# Patient Record
Sex: Female | Born: 1985 | Race: White | Hispanic: No | Smoking: Never smoker
Health system: Southern US, Community
[De-identification: ages and names within clinical notes are randomized; demographics above are authoritative.]

## PROBLEM LIST (undated history)

## (undated) DIAGNOSIS — F32A Depression, unspecified: Secondary | ICD-10-CM

## (undated) DIAGNOSIS — N83209 Unspecified ovarian cyst, unspecified side: Secondary | ICD-10-CM

## (undated) DIAGNOSIS — E538 Deficiency of other specified B group vitamins: Secondary | ICD-10-CM

## (undated) HISTORY — DX: Deficiency of other specified B group vitamins: E53.8

## (undated) HISTORY — DX: Unspecified ovarian cyst, unspecified side: N83.209

## (undated) HISTORY — PX: LAPAROSCOPY: SHX197

## (undated) HISTORY — DX: Depression, unspecified: F32.A

---

## 2009-06-24 DIAGNOSIS — N809 Endometriosis, unspecified: Secondary | ICD-10-CM

## 2009-06-24 HISTORY — DX: Endometriosis, unspecified: N80.9

## 2010-06-24 DIAGNOSIS — R87619 Unspecified abnormal cytological findings in specimens from cervix uteri: Secondary | ICD-10-CM

## 2010-06-24 HISTORY — DX: Unspecified abnormal cytological findings in specimens from cervix uteri: R87.619

## 2013-06-24 HISTORY — PX: TONSILLECTOMY: SUR1361

## 2018-05-25 ENCOUNTER — Ambulatory Visit: Payer: Self-pay | Admitting: Family Medicine

## 2018-06-30 ENCOUNTER — Ambulatory Visit: Payer: BC Managed Care – PPO | Admitting: Family Medicine

## 2018-06-30 ENCOUNTER — Encounter: Payer: Self-pay | Admitting: Family Medicine

## 2018-06-30 VITALS — BP 124/80 | HR 70 | Ht 68.0 in | Wt 240.0 lb

## 2018-06-30 DIAGNOSIS — Z23 Encounter for immunization: Secondary | ICD-10-CM

## 2018-06-30 DIAGNOSIS — Z6836 Body mass index (BMI) 36.0-36.9, adult: Secondary | ICD-10-CM | POA: Diagnosis not present

## 2018-06-30 DIAGNOSIS — Z7689 Persons encountering health services in other specified circumstances: Secondary | ICD-10-CM | POA: Diagnosis not present

## 2018-06-30 NOTE — Progress Notes (Signed)
Date:  06/30/2018   Name:  Heidi Johnston   DOB:  June 15, 1986   MRN:  [id number]   Chief Complaint: Establish Care (new to area)  Patient is a 33 year old female who presents for a comprehensive physical exam. The patient reports the following problems:none. Health maintenance has been reviewed immunizations.   Review of Systems  Constitutional: Negative.  Negative for chills, fatigue, fever and unexpected weight change.  HENT: Negative for congestion, ear discharge, ear pain, rhinorrhea, sinus pressure, sneezing and sore throat.   Eyes: Negative for photophobia, pain, discharge, redness and itching.  Respiratory: Negative for cough, shortness of breath, wheezing and stridor.   Gastrointestinal: Negative for abdominal pain, blood in stool, constipation, diarrhea, nausea and vomiting.  Endocrine: Negative for cold intolerance, heat intolerance, polydipsia, polyphagia and polyuria.  Genitourinary: Negative for dysuria, flank pain, frequency, hematuria, menstrual problem, pelvic pain, urgency, vaginal bleeding and vaginal discharge.  Musculoskeletal: Negative for arthralgias, back pain and myalgias.  Skin: Negative for rash.  Allergic/Immunologic: Negative for environmental allergies and food allergies.  Neurological: Negative for dizziness, weakness, light-headedness, numbness and headaches.  Hematological: Negative for adenopathy. Does not bruise/bleed easily.  Psychiatric/Behavioral: Negative for dysphoric mood. The patient is not nervous/anxious.     There are no active problems to display for this patient.   No Known Allergies  Past Surgical History:  Procedure Laterality Date  . LAPAROSCOPY     endometriosis  . TONSILLECTOMY  2015    Social History   Tobacco Use  . Smoking status: Never Smoker  . Smokeless tobacco: Never Used  Substance Use Topics  . Alcohol use: Never    Frequency: Never  . Drug use: Never     Medication list has been reviewed and  updated.  Current Meds  Medication Sig  . Norethindrone-Ethinyl Estradiol-Fe Biphas (LO LOESTRIN FE) 1 MG-10 MCG / 10 MCG tablet Take 1 tablet by mouth daily.    PHQ 2/9 Scores 06/30/2018  PHQ - 2 Score 0  PHQ- 9 Score 0    Physical Exam Vitals signs and nursing note reviewed.  Constitutional:      General: She is not in acute distress.    Appearance: She is not diaphoretic.  HENT:     Head: Normocephalic and atraumatic.     Right Ear: External ear normal.     Left Ear: External ear normal.     Nose: Nose normal.  Eyes:     General:        Right eye: No discharge.        Left eye: No discharge.     Conjunctiva/sclera: Conjunctivae normal.     Pupils: Pupils are equal, round, and reactive to light.  Neck:     Musculoskeletal: Normal range of motion and neck supple.     Thyroid: No thyromegaly.     Vascular: No JVD.  Cardiovascular:     Rate and Rhythm: Normal rate and regular rhythm.     Heart sounds: Normal heart sounds. No murmur. No friction rub. No gallop.   Pulmonary:     Effort: Pulmonary effort is normal.     Breath sounds: Normal breath sounds.  Abdominal:     General: Bowel sounds are normal.     Palpations: Abdomen is soft. There is no mass.     Tenderness: There is no abdominal tenderness. There is no guarding.  Musculoskeletal: Normal range of motion.  Lymphadenopathy:     Cervical: No cervical adenopathy.  Skin:  General: Skin is warm and dry.  Neurological:     Mental Status: She is alert.     Deep Tendon Reflexes: Reflexes are normal and symmetric.     BP 124/80   Pulse 70   Ht 5\' 8"  (1.727 m)   Wt 240 lb (108.9 kg)   LMP 06/21/2018 (Approximate)   BMI 36.49 kg/m   Assessment and Plan: 1. BMI 36.0-36.9,adult Health risks of being over weight were discussed and patient was counseled on weight loss options and exercise.  2. Establishing care with new doctor, encounter for And establish care with new physician patient's history was reviewed  and physical exam was done.  3. Need for diphtheria-tetanus-pertussis (Tdap) vaccine Gust and administered - Tdap vaccine greater than or equal to 7yo IM  4. Flu vaccine need Gust and administered - Flu Vaccine QUAD 6+ mos PF IM (Fluarix Quad PF)

## 2018-07-14 ENCOUNTER — Other Ambulatory Visit: Payer: Self-pay

## 2018-07-14 ENCOUNTER — Ambulatory Visit (INDEPENDENT_AMBULATORY_CARE_PROVIDER_SITE_OTHER): Payer: BC Managed Care – PPO | Admitting: Family Medicine

## 2018-07-14 ENCOUNTER — Encounter: Payer: Self-pay | Admitting: Family Medicine

## 2018-07-14 VITALS — BP 118/80 | HR 64 | Resp 16 | Ht 68.0 in | Wt 241.8 lb

## 2018-07-14 DIAGNOSIS — F329 Major depressive disorder, single episode, unspecified: Secondary | ICD-10-CM | POA: Diagnosis not present

## 2018-07-14 DIAGNOSIS — Z0000 Encounter for general adult medical examination without abnormal findings: Secondary | ICD-10-CM | POA: Diagnosis not present

## 2018-07-14 DIAGNOSIS — F32A Depression, unspecified: Secondary | ICD-10-CM

## 2018-07-14 DIAGNOSIS — Z8742 Personal history of other diseases of the female genital tract: Secondary | ICD-10-CM | POA: Diagnosis not present

## 2018-07-14 DIAGNOSIS — N809 Endometriosis, unspecified: Secondary | ICD-10-CM | POA: Diagnosis not present

## 2018-07-14 DIAGNOSIS — R5383 Other fatigue: Secondary | ICD-10-CM

## 2018-07-14 MED ORDER — SERTRALINE HCL 50 MG PO TABS: 50.0000 mg | ORAL_TABLET | Freq: Every day | ORAL | 3 refills | Status: DC

## 2018-07-14 NOTE — Progress Notes (Signed)
Date:  07/14/2018   Name:  Heidi Johnston   DOB:  07/03/1985   MRN:  [id number]   Chief Complaint: Annual Exam (breast exam, no pap)  Patient is a 33 year old female who presents for a comprehensive physical exam. The patient reports the following problems: fertility/depression. Health maintenance has been reviewed upto date  Depression         This is a new problem.  The current episode started more than 1 year ago.   The onset quality is gradual.   The problem occurs constantly.  The problem has been waxing and waning since onset.  Associated symptoms include decreased concentration, fatigue, irritable, restlessness, decreased interest, appetite change, body aches, myalgias, headaches and sad.  Associated symptoms include no helplessness, no hopelessness, does not have insomnia, no indigestion and no suicidal ideas.     The symptoms are aggravated by work stress and family issues (divorce).  Past treatments include nothing.   Review of Systems  Constitutional: Positive for appetite change and fatigue. Negative for chills, fever and unexpected weight change.  HENT: Negative for congestion, ear discharge, ear pain, rhinorrhea, sinus pressure, sneezing and sore throat.   Eyes: Negative for photophobia, pain, discharge, redness and itching.  Respiratory: Negative for cough, shortness of breath, wheezing and stridor.   Gastrointestinal: Negative for abdominal pain, blood in stool, constipation, diarrhea, nausea and vomiting.  Endocrine: Negative for cold intolerance, heat intolerance, polydipsia, polyphagia and polyuria.  Genitourinary: Negative for dysuria, flank pain, frequency, hematuria, menstrual problem, pelvic pain, urgency, vaginal bleeding and vaginal discharge.  Musculoskeletal: Positive for myalgias. Negative for arthralgias and back pain.  Skin: Negative for rash.  Allergic/Immunologic: Negative for environmental allergies and food allergies.  Neurological: Positive for  headaches. Negative for dizziness, weakness, light-headedness and numbness.  Hematological: Negative for adenopathy. Does not bruise/bleed easily.  Psychiatric/Behavioral: Positive for decreased concentration and depression. Negative for dysphoric mood and suicidal ideas. The patient is not nervous/anxious and does not have insomnia.     There are no active problems to display for this patient.   No Known Allergies  Past Surgical History:  Procedure Laterality Date  . LAPAROSCOPY     endometriosis  . TONSILLECTOMY  2015    Social History   Tobacco Use  . Smoking status: Never Smoker  . Smokeless tobacco: Never Used  Substance Use Topics  . Alcohol use: Never    Frequency: Never  . Drug use: Never     Medication list has been reviewed and updated.  Current Meds  Medication Sig  . Norethindrone-Ethinyl Estradiol-Fe Biphas (LO LOESTRIN FE) 1 MG-10 MCG / 10 MCG tablet Take 1 tablet by mouth daily.    PHQ 2/9 Scores 06/30/2018  PHQ - 2 Score 0  PHQ- 9 Score 0    Physical Exam Vitals signs and nursing note reviewed.  Constitutional:      General: She is irritable. She is not in acute distress.    Appearance: She is overweight. She is not diaphoretic.  HENT:     Head: Normocephalic and atraumatic.     Jaw: There is normal jaw occlusion.     Right Ear: External ear normal.     Left Ear: External ear normal.     Nose: Nose normal.  Eyes:     General: Lids are normal.        Right eye: No discharge.        Left eye: No discharge.     Conjunctiva/sclera: Conjunctivae  normal.     Pupils: Pupils are equal, round, and reactive to light.  Neck:     Musculoskeletal: Normal range of motion and neck supple. Normal range of motion. No edema, erythema or neck rigidity.     Thyroid: No thyroid mass or thyromegaly.     Vascular: Normal carotid pulses. No carotid bruit, hepatojugular reflux or JVD.  Cardiovascular:     Rate and Rhythm: Normal rate and regular rhythm.     Chest  Wall: PMI is not displaced.     Pulses: Normal pulses.          Carotid pulses are 2+ on the right side and 2+ on the left side.      Radial pulses are 2+ on the right side and 2+ on the left side.       Femoral pulses are 2+ on the right side and 2+ on the left side.      Popliteal pulses are 2+ on the right side and 2+ on the left side.       Dorsalis pedis pulses are 2+ on the right side and 2+ on the left side.       Posterior tibial pulses are 2+ on the right side and 2+ on the left side.     Heart sounds: Normal heart sounds, S1 normal and S2 normal. No murmur. No systolic murmur. No diastolic murmur. No friction rub. No gallop. No S3 or S4 sounds.   Pulmonary:     Effort: Pulmonary effort is normal.     Breath sounds: Normal breath sounds. No decreased air movement. No decreased breath sounds, wheezing, rhonchi or rales.  Chest:     Chest wall: No mass.     Breasts: Breasts are symmetrical.        Right: Normal. No swelling, bleeding, inverted nipple, mass, nipple discharge, skin change or tenderness.        Left: Normal. No swelling, bleeding, inverted nipple, mass, nipple discharge, skin change or tenderness.  Abdominal:     General: Bowel sounds are normal. There is no distension or abdominal bruit. There are no signs of injury.     Palpations: Abdomen is soft. There is no hepatomegaly, splenomegaly or mass.     Tenderness: There is no abdominal tenderness. There is no right CVA tenderness, left CVA tenderness, guarding or rebound.  Musculoskeletal: Normal range of motion.     Right shoulder: Normal.  Lymphadenopathy:     Head:     Right side of head: No submandibular adenopathy.     Left side of head: No submandibular adenopathy.     Cervical: No cervical adenopathy.     Right cervical: No superficial cervical adenopathy.    Left cervical: No superficial cervical adenopathy.     Upper Body:     Right upper body: No supraclavicular or axillary adenopathy.     Left upper  body: No supraclavicular or axillary adenopathy.  Skin:    General: Skin is warm and dry.     Capillary Refill: Capillary refill takes less than 2 seconds.  Neurological:     Mental Status: She is alert.     Deep Tendon Reflexes: Reflexes are normal and symmetric.     Reflex Scores:      Tricep reflexes are 2+ on the right side and 2+ on the left side.      Bicep reflexes are 2+ on the right side and 2+ on the left side.  Brachioradialis reflexes are 2+ on the right side and 2+ on the left side.      Patellar reflexes are 2+ on the right side and 2+ on the left side.      Achilles reflexes are 2+ on the right side and 2+ on the left side.    BP 118/80   Pulse 64   Resp 16   Ht 5\' 8"  (1.727 m)   Wt 241 lb 12.8 oz (109.7 kg)   LMP 07/11/2018   BMI 36.77 kg/m   Assessment and Plan: 1. Annual physical exam Patient presents for annual physical exam.  Patient relates no subjective nor objective concerns during the history and physical exam other than noted below.  Attained baseline labs of renal function panel and a thyroid panel with TSH. - Renal Function Panel - Lipid panel - Thyroid Panel With TSH  2. Reactive depression Patient has had a recent marital concern which is caused the active depression.  Patient was initiated on sertraline 1/2 tablet of a 50 mg for 2 weeks then to progress to 1 tablet a day.  Will recheck in 6 weeks. - sertraline (ZOLOFT) 50 MG tablet; Take 1 tablet (50 mg total) by mouth daily. One half tablet q day for 2 weeks  Dispense: 30 tablet; Refill: 3  3. Endometriosis Has a longstanding history of endometriosis for which she is on birth control pills.  Patient has concerned about fertility but it is unknown likely to be able to discontinue her hormonal therapy.  Refer to GYN for evaluation. - Ambulatory referral to Obstetrics / Gynecology  4. History of abnormal cervical Pap smear Has a history of abnormal cervical Pap smears referral to Dr. Dalbert GarnetBeasley  and [facility] for evaluation if necessary further Pap smear. - Ambulatory referral to Obstetrics / Gynecology  5. Fatigue due to depression Patient has had increasing depression with her fatigue and will check a thyroid panel with TSH to rule out the possibility of an underactive thyroid. - Thyroid Panel With TSH

## 2018-07-15 LAB — LIPID PANEL
CHOLESTEROL TOTAL: 226 mg/dL — AB (ref 100–199)
Chol/HDL Ratio: 3.7 ratio (ref 0.0–4.4)
HDL: 61 mg/dL (ref >39)
LDL Calculated: 140 mg/dL — ABNORMAL HIGH (ref 0–99)
TRIGLYCERIDES: 125 mg/dL (ref 0–149)
VLDL Cholesterol Cal: 25 mg/dL (ref 5–40)

## 2018-07-15 LAB — RENAL FUNCTION PANEL
Albumin: 4.5 g/dL (ref 3.8–4.8)
BUN/Creatinine Ratio: 11 (ref 9–23)
BUN: 10 mg/dL (ref 6–20)
CALCIUM: 9.4 mg/dL (ref 8.7–10.2)
CO2: 23 mmol/L (ref 20–29)
Chloride: 101 mmol/L (ref 96–106)
Creatinine, Ser: 0.94 mg/dL (ref 0.57–1.00)
GFR calc Af Amer: 93 mL/min/{1.73_m2} (ref >59)
GFR calc non Af Amer: 81 mL/min/{1.73_m2} (ref >59)
Glucose: 94 mg/dL (ref 65–99)
Phosphorus: 3.4 mg/dL (ref 3.0–4.3)
Potassium: 4.2 mmol/L (ref 3.5–5.2)
Sodium: 138 mmol/L (ref 134–144)

## 2018-07-15 LAB — TSH: TSH: 2.84 u[IU]/mL (ref 0.450–4.500)

## 2018-08-25 ENCOUNTER — Ambulatory Visit: Payer: BC Managed Care – PPO | Admitting: Family Medicine

## 2018-08-25 ENCOUNTER — Encounter: Payer: Self-pay | Admitting: Family Medicine

## 2018-08-25 VITALS — BP 120/80 | HR 80 | Ht 68.0 in | Wt 243.0 lb

## 2018-08-25 DIAGNOSIS — F329 Major depressive disorder, single episode, unspecified: Secondary | ICD-10-CM | POA: Diagnosis not present

## 2018-08-25 DIAGNOSIS — L0100 Impetigo, unspecified: Secondary | ICD-10-CM | POA: Diagnosis not present

## 2018-08-25 DIAGNOSIS — F419 Anxiety disorder, unspecified: Secondary | ICD-10-CM

## 2018-08-25 MED ORDER — DOXYCYCLINE HYCLATE 100 MG PO TABS: 100.0000 mg | ORAL_TABLET | Freq: Two times a day (BID) | ORAL | 0 refills | Status: DC

## 2018-08-25 MED ORDER — SERTRALINE HCL 100 MG PO TABS: 100.0000 mg | ORAL_TABLET | Freq: Every day | ORAL | 3 refills | Status: DC

## 2018-08-25 MED ORDER — MUPIROCIN 2 % EX OINT: 1.0000 "application " | TOPICAL_OINTMENT | Freq: Two times a day (BID) | CUTANEOUS | 0 refills | Status: DC

## 2018-08-25 NOTE — Progress Notes (Signed)
Date:  08/25/2018   Name:  Heidi Johnston   DOB:  08-18-1985   MRN:  [id number]   Chief Complaint: Depression (follow up- PHQ9=7 down from 17. )  Depression         This is a new problem.  The current episode started more than 1 month ago.   The onset quality is gradual.   The problem occurs daily.  The problem has been gradually improving since onset.  Associated symptoms include decreased concentration, irritable, restlessness, decreased interest and sad.  Associated symptoms include no fatigue, no helplessness, no hopelessness, does not have insomnia, no myalgias, no headaches and no suicidal ideas.  Past treatments include SSRIs - Selective serotonin reuptake inhibitors.  Compliance with treatment is good.  Previous treatment provided moderate relief.  Past medical history includes anxiety.   Anxiety  Presents for follow-up visit. Symptoms include decreased concentration and restlessness. Patient reports no compulsions, dizziness, excessive worry, feeling of choking, hyperventilation, insomnia, nausea, nervous/anxious behavior, palpitations, panic, shortness of breath or suicidal ideas. The severity of symptoms is mild.    Rash  This is a new problem. The current episode started in the past 7 days. The problem has been gradually improving since onset. The affected locations include the abdomen. The rash is characterized by redness, itchiness and draining. Pertinent negatives include no congestion, cough, diarrhea, eye pain, fatigue, fever, rhinorrhea, shortness of breath, sore throat or vomiting.    Review of Systems  Constitutional: Negative.  Negative for chills, fatigue, fever and unexpected weight change.  HENT: Negative for congestion, ear discharge, ear pain, rhinorrhea, sinus pressure, sneezing and sore throat.   Eyes: Negative for photophobia, pain, discharge, redness and itching.  Respiratory: Negative for cough, shortness of breath, wheezing and stridor.   Cardiovascular:  Negative for palpitations.  Gastrointestinal: Negative for abdominal pain, blood in stool, constipation, diarrhea, nausea and vomiting.  Endocrine: Negative for cold intolerance, heat intolerance, polydipsia, polyphagia and polyuria.  Genitourinary: Negative for dysuria, flank pain, frequency, hematuria, menstrual problem, pelvic pain, urgency, vaginal bleeding and vaginal discharge.  Musculoskeletal: Negative for arthralgias, back pain and myalgias.  Skin: Negative for rash.  Allergic/Immunologic: Negative for environmental allergies and food allergies.  Neurological: Negative for dizziness, weakness, light-headedness, numbness and headaches.  Hematological: Negative for adenopathy. Does not bruise/bleed easily.  Psychiatric/Behavioral: Positive for decreased concentration and depression. Negative for dysphoric mood and suicidal ideas. The patient is not nervous/anxious and does not have insomnia.     There are no active problems to display for this patient.   No Known Allergies  Past Surgical History:  Procedure Laterality Date  . LAPAROSCOPY     endometriosis  . TONSILLECTOMY  2015    Social History   Tobacco Use  . Smoking status: Never Smoker  . Smokeless tobacco: Never Used  Substance Use Topics  . Alcohol use: Never    Frequency: Never  . Drug use: Never     Medication list has been reviewed and updated.  Current Meds  Medication Sig  . Norethindrone-Ethinyl Estradiol-Fe Biphas (LO LOESTRIN FE) 1 MG-10 MCG / 10 MCG tablet Take 1 tablet by mouth daily.  . sertraline (ZOLOFT) 50 MG tablet Take 1 tablet (50 mg total) by mouth daily. One half tablet q day for 2 weeks (Patient taking differently: Take 50 mg by mouth daily. )    PHQ 2/9 Scores 08/25/2018 07/14/2018 06/30/2018  PHQ - 2 Score 3 4 0  PHQ- 9 Score 7 17 0  Physical Exam Vitals signs and nursing note reviewed.  Constitutional:      General: She is irritable. She is not in acute distress.    Appearance:  She is not diaphoretic.  HENT:     Head: Normocephalic and atraumatic.     Right Ear: Tympanic membrane, ear canal and external ear normal.     Left Ear: Tympanic membrane, ear canal and external ear normal.     Nose: Nose normal.  Eyes:     General:        Right eye: No discharge.        Left eye: No discharge.     Conjunctiva/sclera: Conjunctivae normal.     Pupils: Pupils are equal, round, and reactive to light.  Neck:     Musculoskeletal: Normal range of motion and neck supple.     Thyroid: No thyromegaly.     Vascular: No JVD.  Cardiovascular:     Rate and Rhythm: Normal rate and regular rhythm.     Heart sounds: Normal heart sounds. No murmur. No friction rub. No gallop.   Pulmonary:     Effort: Pulmonary effort is normal.     Breath sounds: Normal breath sounds. No wheezing or rhonchi.  Abdominal:     General: Bowel sounds are normal.     Palpations: Abdomen is soft. There is no mass.     Tenderness: There is no abdominal tenderness. There is no guarding.  Musculoskeletal: Normal range of motion.  Lymphadenopathy:     Cervical: No cervical adenopathy.  Skin:    General: Skin is warm and dry.  Neurological:     Mental Status: She is alert.     Deep Tendon Reflexes: Reflexes are normal and symmetric.     BP 120/80   Pulse 80   Ht 5\' 8"  (1.727 m)   Wt 243 lb (110.2 kg)   LMP 08/11/2018 (Approximate)   BMI 36.95 kg/m   Assessment and Plan: 1. Reactive depression Patient has an improved PHQ score from 17-7 on present dosing of sertraline 50 mg.  However patient would like to creased the dosing and therefore she will 1-1/2 of her present dosing of 50 which would be a 75 mg dose 2 weeks and then a written prescription for 100 mg sertraline was given to patient to fill afterwards patient will return in 3 months for recheck as to how she is doing on this increased dosing.  2. Anxiety Patient has also noticed that there is been significant improvement of her anxiety and  would like to continue with her sertraline.  3. Impetigo It was noted that she had in the back area some irritation with redness and discomfort.  There is no vesicular rash patient is likely to have a folliculitis for which doxycycline 100 mg twice a day and Bactroban will be applied on a twice a day basis. - doxycycline (VIBRA-TABS) 100 MG tablet; Take 1 tablet (100 mg total) by mouth 2 (two) times daily.  Dispense: 20 tablet; Refill: 0 - mupirocin ointment (BACTROBAN) 2 %; Apply 1 application topically 2 (two) times daily.  Dispense: 22 g; Refill: 0

## 2018-08-31 ENCOUNTER — Other Ambulatory Visit: Payer: Self-pay

## 2018-08-31 DIAGNOSIS — T3695XA Adverse effect of unspecified systemic antibiotic, initial encounter: Principal | ICD-10-CM

## 2018-08-31 DIAGNOSIS — B379 Candidiasis, unspecified: Secondary | ICD-10-CM

## 2018-08-31 MED ORDER — FLUCONAZOLE 150 MG PO TABS: 150.0000 mg | ORAL_TABLET | Freq: Once | ORAL | 0 refills | Status: AC

## 2018-08-31 NOTE — Progress Notes (Unsigned)
Sent in Diflucan due to Doxy

## 2018-09-23 LAB — HM PAP SMEAR: HM Pap smear: ABNORMAL

## 2018-11-23 ENCOUNTER — Telehealth: Payer: Self-pay | Admitting: Family Medicine

## 2018-11-23 ENCOUNTER — Telehealth: Payer: Self-pay

## 2018-11-23 NOTE — Telephone Encounter (Signed)
Pt called stating she wanted to cancel her 3 month follow up with Dr Heidi Johnston & stated she did not want to reschedule the appointment.

## 2018-11-23 NOTE — Telephone Encounter (Signed)
Pt called to cancel appt for follow up depression. Was offered appt and said she did not want to reschedule

## 2018-11-26 ENCOUNTER — Ambulatory Visit: Payer: BC Managed Care – PPO | Admitting: Family Medicine

## 2018-12-21 ENCOUNTER — Other Ambulatory Visit: Payer: Self-pay | Admitting: Family Medicine

## 2019-01-21 ENCOUNTER — Other Ambulatory Visit: Payer: Self-pay | Admitting: Family Medicine

## 2019-01-21 ENCOUNTER — Other Ambulatory Visit: Payer: Self-pay

## 2019-01-22 ENCOUNTER — Other Ambulatory Visit: Payer: Self-pay

## 2019-01-22 ENCOUNTER — Encounter: Payer: Self-pay | Admitting: Family Medicine

## 2019-01-22 ENCOUNTER — Ambulatory Visit: Payer: BC Managed Care – PPO | Admitting: Family Medicine

## 2019-01-22 VITALS — BP 134/76 | HR 90 | Ht 68.0 in | Wt 250.0 lb

## 2019-01-22 DIAGNOSIS — F329 Major depressive disorder, single episode, unspecified: Secondary | ICD-10-CM

## 2019-01-22 DIAGNOSIS — L600 Ingrowing nail: Secondary | ICD-10-CM | POA: Diagnosis not present

## 2019-01-22 DIAGNOSIS — F419 Anxiety disorder, unspecified: Secondary | ICD-10-CM | POA: Diagnosis not present

## 2019-01-22 MED ORDER — SERTRALINE HCL 100 MG PO TABS: 100.0000 mg | ORAL_TABLET | Freq: Every day | ORAL | 1 refills | Status: DC

## 2019-01-22 MED ORDER — CEPHALEXIN 500 MG PO CAPS: 500.0000 mg | ORAL_CAPSULE | Freq: Four times a day (QID) | ORAL | 1 refills | Status: DC

## 2019-01-22 MED ORDER — MUPIROCIN 2 % EX OINT: 1.0000 "application " | TOPICAL_OINTMENT | Freq: Two times a day (BID) | CUTANEOUS | 0 refills | Status: DC

## 2019-01-22 NOTE — Progress Notes (Signed)
Date:  01/22/2019   Name:  Heidi Johnston   DOB:  03/24/1986   MRN:  [id number]   Chief Complaint: Depression (PHQ9=2)  Depression        This is a chronic problem.  The current episode started more than 1 year ago.   The onset quality is sudden.   The problem occurs constantly.  The problem has been gradually improving since onset.  Associated symptoms include decreased concentration.  Associated symptoms include no fatigue, no helplessness, no hopelessness, does not have insomnia, not irritable, no restlessness, no decreased interest, no appetite change, no body aches, no myalgias, no headaches, no indigestion, not sad and no suicidal ideas.     The symptoms are aggravated by medication.  Past treatments include SSRIs - Selective serotonin reuptake inhibitors.  Compliance with treatment is good.  Previous treatment provided mild relief.  Risk factors include a change in medication usage/dosage.    Review of Systems  Constitutional: Negative.  Negative for appetite change, chills, fatigue, fever and unexpected weight change.  HENT: Negative for congestion, ear discharge, ear pain, rhinorrhea, sinus pressure, sneezing and sore throat.   Eyes: Negative for photophobia, pain, discharge, redness and itching.  Respiratory: Negative for cough, shortness of breath, wheezing and stridor.   Gastrointestinal: Negative for abdominal pain, blood in stool, constipation, diarrhea, nausea and vomiting.  Endocrine: Negative for cold intolerance, heat intolerance, polydipsia, polyphagia and polyuria.  Genitourinary: Negative for dysuria, flank pain, frequency, hematuria, menstrual problem, pelvic pain, urgency, vaginal bleeding and vaginal discharge.  Musculoskeletal: Negative for arthralgias, back pain and myalgias.  Skin: Negative for rash.  Allergic/Immunologic: Negative for environmental allergies and food allergies.  Neurological: Negative for dizziness, weakness, light-headedness, numbness and  headaches.  Hematological: Negative for adenopathy. Does not bruise/bleed easily.  Psychiatric/Behavioral: Positive for decreased concentration and depression. Negative for dysphoric mood and suicidal ideas. The patient is not nervous/anxious and does not have insomnia.     Patient Active Problem List   Diagnosis Date Noted   Reactive depression 08/25/2018   Anxiety 08/25/2018    No Known Allergies  Past Surgical History:  Procedure Laterality Date   LAPAROSCOPY     endometriosis   TONSILLECTOMY  2015    Social History   Tobacco Use   Smoking status: Never Smoker   Smokeless tobacco: Never Used  Substance Use Topics   Alcohol use: Never    Frequency: Never   Drug use: Never     Medication list has been reviewed and updated.  Current Meds  Medication Sig   Norethindrone-Ethinyl Estradiol-Fe Biphas (LO LOESTRIN FE) 1 MG-10 MCG / 10 MCG tablet Take 1 tablet by mouth daily.   sertraline (ZOLOFT) 100 MG tablet TAKE 1 TABLET BY MOUTH EVERY DAY    PHQ 2/9 Scores 01/22/2019 08/25/2018 07/14/2018 06/30/2018  PHQ - 2 Score 0 3 4 0  PHQ- 9 Score 2 7 17  0    BP Readings from Last 3 Encounters:  01/22/19 134/76  08/25/18 120/80  07/14/18 118/80    Physical Exam Vitals signs and nursing note reviewed.  Constitutional:      General: She is not irritable.She is not in acute distress.    Appearance: She is not diaphoretic.  HENT:     Head: Normocephalic and atraumatic.     Right Ear: Tympanic membrane, ear canal and external ear normal.     Left Ear: Tympanic membrane, ear canal and external ear normal.     Nose: Nose normal. No  congestion or rhinorrhea.  Eyes:     General:        Right eye: No discharge.        Left eye: No discharge.     Conjunctiva/sclera: Conjunctivae normal.     Pupils: Pupils are equal, round, and reactive to light.  Neck:     Musculoskeletal: Normal range of motion and neck supple.     Thyroid: No thyromegaly.     Vascular: No JVD.    Cardiovascular:     Rate and Rhythm: Normal rate and regular rhythm.     Heart sounds: Normal heart sounds. No murmur. No friction rub. No gallop.   Pulmonary:     Effort: Pulmonary effort is normal.     Breath sounds: Normal breath sounds. No wheezing or rhonchi.  Abdominal:     General: Bowel sounds are normal.     Palpations: Abdomen is soft. There is no mass.     Tenderness: There is no abdominal tenderness. There is no guarding.  Musculoskeletal: Normal range of motion.  Lymphadenopathy:     Cervical: No cervical adenopathy.  Skin:    General: Skin is warm and dry.     Capillary Refill: Capillary refill takes less than 2 seconds.  Neurological:     Mental Status: She is alert.     Deep Tendon Reflexes: Reflexes are normal and symmetric.     Wt Readings from Last 3 Encounters:  01/22/19 250 lb (113.4 kg)  08/25/18 243 lb (110.2 kg)  07/14/18 241 lb 12.8 oz (109.7 kg)    BP 134/76    Pulse 90    Ht 5\' 8"  (1.727 m)    Wt 250 lb (113.4 kg)    SpO2 98%    BMI 38.01 kg/m   Assessment and Plan: 1. Ingrowing nail, left great toe Patient has some mild ingrowing nail of the lateral aspect of the left great toe.  This looks like it is already been wedged out will apply Bactroban and use Keflex 500 mg twice a day for 5 days. - Ambulatory referral to Dermatology - mupirocin ointment (BACTROBAN) 2 %; Apply 1 application topically 2 (two) times daily.  Dispense: 22 g; Refill: 0 - cephALEXin (KEFLEX) 500 MG capsule; Take 1 capsule (500 mg total) by mouth 4 (four) times daily.  Dispense: 30 capsule; Refill: 1  2. Anxiety Chronic.  Controlled.  CAD score 0.  Although patient does concern for upcoming school year and presents of the COVID circumstance.  Continue Zoloft 100 mg daily.  3. Reactive depression PHQ 9 score 2.  Controlled.  Persistent.  Patient will continue Zoloft 100 mg/day.

## 2019-01-22 NOTE — Patient Instructions (Signed)

## 2019-04-12 ENCOUNTER — Telehealth: Payer: Self-pay

## 2019-04-12 NOTE — Telephone Encounter (Signed)
refills declined patient asking why. Advised she has never gotten them from [place]. She will ask OBGYN but wants to see if we will still give it to her now since she has been on it 20 yrs.

## 2019-05-06 ENCOUNTER — Other Ambulatory Visit: Payer: Self-pay

## 2019-05-06 DIAGNOSIS — Z20822 Contact with and (suspected) exposure to covid-19: Secondary | ICD-10-CM

## 2019-05-09 LAB — NOVEL CORONAVIRUS, NAA: SARS-CoV-2, NAA: NOT DETECTED

## 2019-05-25 ENCOUNTER — Telehealth: Payer: BC Managed Care – PPO | Admitting: Nurse Practitioner

## 2019-05-25 DIAGNOSIS — L03211 Cellulitis of face: Secondary | ICD-10-CM

## 2019-05-25 MED ORDER — CEPHALEXIN 500 MG PO CAPS: 500.0000 mg | ORAL_CAPSULE | Freq: Two times a day (BID) | ORAL | 0 refills | Status: DC

## 2019-05-25 NOTE — Progress Notes (Signed)

## 2019-05-28 ENCOUNTER — Encounter: Payer: Self-pay | Admitting: Family Medicine

## 2019-05-28 ENCOUNTER — Other Ambulatory Visit: Payer: Self-pay

## 2019-05-28 DIAGNOSIS — L0100 Impetigo, unspecified: Secondary | ICD-10-CM

## 2019-05-28 MED ORDER — CEPHALEXIN 500 MG PO CAPS: 500.0000 mg | ORAL_CAPSULE | Freq: Four times a day (QID) | ORAL | 0 refills | Status: DC

## 2019-07-14 ENCOUNTER — Ambulatory Visit (INDEPENDENT_AMBULATORY_CARE_PROVIDER_SITE_OTHER): Payer: BC Managed Care – PPO | Admitting: Family Medicine

## 2019-07-14 ENCOUNTER — Other Ambulatory Visit: Payer: Self-pay

## 2019-07-14 ENCOUNTER — Encounter: Payer: Self-pay | Admitting: Family Medicine

## 2019-07-14 VITALS — BP 120/64 | HR 68 | Ht 68.0 in | Wt 266.0 lb

## 2019-07-14 DIAGNOSIS — Z8619 Personal history of other infectious and parasitic diseases: Secondary | ICD-10-CM

## 2019-07-14 DIAGNOSIS — L0100 Impetigo, unspecified: Secondary | ICD-10-CM

## 2019-07-14 MED ORDER — AMOXICILLIN-POT CLAVULANATE 875-125 MG PO TABS: 1.0000 | ORAL_TABLET | Freq: Two times a day (BID) | ORAL | 0 refills | Status: DC

## 2019-07-14 MED ORDER — FLUCONAZOLE 150 MG PO TABS: 150.0000 mg | ORAL_TABLET | Freq: Once | ORAL | 0 refills | Status: AC

## 2019-07-14 MED ORDER — MUPIROCIN 2 % EX OINT: 1.0000 "application " | TOPICAL_OINTMENT | Freq: Two times a day (BID) | CUTANEOUS | 0 refills | Status: DC

## 2019-07-14 NOTE — Patient Instructions (Signed)
Preventing Health Care-Associated MRSA Methicillin-resistant Staphylococcus aureus (MRSA) infection is caused by a type of bacteria called Staphylococcus aureus,or staph, that no longer responds to common antibiotic medicines (drug-resistant bacteria). Infection may occur during a stay in a hospital, rehabilitation facility, nursing home, or other health care facility. This is called health care-associated MRSA (HA-MRSA). How can HA-MRSA affect me? Most of the time, MRSA can be on the skin or in the nose without causing problems. This is called colonization. However, if MRSA enters the body through a cut, a sore, or an invasive medical procedure or device, it can cause a serious infection. These infections can include:  Skin infections.  Bone or joint infections.  Pneumonia.  Bloodstream infections (sepsis). MRSA infections can be hard to treat. They can also spread quickly, especially in health care settings. What can increase my risk? You are more likely to get HA-MRSA if you:  Were recently in hospital or other health care facility.  Had a long hospital stay, especially in an intensive care unit or burn unit.  Have been on antibiotic medicines.  Had a surgery or procedure.  Have colonies of MRSA in your nose or on your skin.  Are on kidney dialysis.  Have a short-term or long-term use of a catheter or IV line.  Have a weak body defense system (immune system).  Have ever had a MRSA infection. What actions can be taken to prevent HA-MRSA? Your health care team will work to help prevent the spread of MRSA. They may:  Screen for MRSA on your skin or in your nose when you are admitted to a hospital.  Give you antibiotics only when you need them.  Wear gloves when working with patients. Gloves will be changed for each patient.  Wash hands after touching blood, body fluids, or other items that have germs.  Wash hands with soap and water or alcohol-based hand sanitizer every  time they enter or leave a patient room.  Use mouth, nose, and eye protection during procedures or regular patient care.  Wear a gown during procedures or regular patient care.  Properly dispose of single-use patient devices, such as needles or bandages.  Clean (disinfect) equipment and surfaces in patient rooms.  Handle soiled laundry in special bags.  Use contact precautions.  Treat known cases of MRSA infections.  Explain how to prevent infection and take care of yourself at home. Follow these instructions after you leave the hospital. What are contact precautions? Contact precautions are steps that your health care team may take to prevent the spread of HA-MRSA among hospital staff and visitors. You may be placed on contact precautions if you:  Were admitted to the hospital with a MRSA infection.  Have MRSA and there are others who have it in the same hospital.  Have MRSA, and you also have: ? A wound with blood and other fluids. ? Diarrhea. ? Bodily fluids that cannot be stopped. Contact precautions may vary based on facility policies. You may be taken off contact precautions depending on your overall health and the policies of the facility. Talk to your health care provider if you have questions about contact precautions. Where to find more information You can get more information about HA-MRSA from:  The Centers for Disease Control and Prevention (CDC): [employer]ee  Your local or state health department. Contact a health care provider if you have:  An infection on your skin, such as: ? A pus-filled pimple or boil. ? A sore (abscess) under your skin  or somewhere in your body.  Signs of infection after a recent procedure, including: ? Warmth, redness, or tenderness around your incision site. ? A red line that spreads from your incision. ? Incision drainage that is tan, yellow, or green. ? A bad smell coming from your incision or bandage (dressing). Get help  right away if you have:  A skin infection and fever and chills.  Nausea or vomiting, or you cannot take medicine without vomiting.  Trouble breathing.  Chest pain. These symptoms may represent a serious problem that is an emergency. Do not wait to see if the symptoms will go away. Get medical help right away. Call your local emergency services (911 in the [place] Do not drive yourself to the hospital. Summary  Health care-associated MRSA (HA-MRSA) is an infection that a person can get during a stay in a hospital, rehabilitation facility, nursing home, or other health care facility.  Your health care team may take steps to prevent the spread of HA-MRSA among hospital staff and visitors. These safety measures are called contact precautions.  Your health care team will explain how to prevent infection and take care of yourself at home. After leaving the hospital, make sure you follow these instructions.  Contact a health care provider if you have signs of infection, such as a boil with pus, a sore under the skin, or warmth, redness, or tenderness.  Get help right away if you have a skin infection and a fever, or if you have nausea, trouble breathing, or chest pain. This information is not intended to replace advice given to you by your health care provider. Make sure you discuss any questions you have with your health care provider. Document Revised: 03/03/2019 Document Reviewed: 08/28/2018 Elsevier Patient Education  2020 [employer]. MRSA Infection, Self-Care, Adult Methicillin-resistant Staphylococcus aureus (MRSA) infection is caused by bacteria that no longer respond to antibiotic medicines. MRSA infection can be hard to treat. Self-care is important after being diagnosed with MRSA. Following instructions for self-care will:  Help you heal well.  Prevent the infection from spreading to others. Your health care provider may also give you more specific instructions. If you have  problems or questions, contact your health care provider. What are the risks? MRSA can be on the skin or in the nose of many people without causing problems. This is called MRSA colonization. However, MRSA can cause problems for you and others if it enters the body through a cut, a sore, or an invasive medical procedure or device.  If MRSA enters your body, it may cause serious problems, such as: ? Skin infections. ? Bone or joint infections. ? Pneumonia. ? Bloodstream infections (sepsis).  If you have these infections, MRSA may spread to others, including: ? Visitors or other patients in the hospital. ? Friends, family, or other people at home or in your community. Supplies needed:  Soap and water.  Germ-free (sterile) dressing.  Alcohol-based hand sanitizer (optional).  Home cleaning solutions that contain bleach.  Clean or disposable towels. How to prevent MRSA infections Take these steps to avoid getting another MRSA infection and to prevent the bacteria from spreading to others. Hand washing   Wash your hands frequently with soap and water. If soap and water are not available, use an alcohol-based hand sanitizer. Dry your hands with a clean or disposable towel.  Make sure that everyone in the household washes their hands often. Wound care  If you have a wound, follow instructions from your health care  provider about how to take care of your wound. Make sure you:  Wash your hands with soap and water before and after you change your bandage (dressing). If soap and water are not available, use an alcohol-based hand sanitizer.  Change your dressing as told by your health care provider.  Leave any stitches (sutures), skin glue, or adhesive strips in place. These skin closures may need to be in place for 2 weeks or longer. If adhesive strip edges start to loosen and curl up, you may trim the loose edges. Do not remove adhesive strips completely unless your health care provider  tells you to do that.  Clean wounds, cuts, and abrasions with soap and water and cover them with dry, sterile dressings until they heal.  Check your wound every day for signs of infection. Check for: ? More redness, swelling, or pain. ? More fluid or blood. ? Warmth. ? Pus or a bad smell.  If you have a wound that seems to be infected, ask your health care provider if a culture should be done for MRSA and other bacteria. Personal hygiene  Maintain good hygiene by bathing often and keeping your body clean.  Wash hands before preparing food.  Always shower after exercising.  Wash towels, bedding, and clothes in the washing machine with detergent and hot water. Dry them in a hot dryer. General tips  Take your antibiotic medicine as told by your health care provider. Take them only when absolutely necessary. Do not stop taking the antibiotic even if you start to feel better.  Avoid close contact with others as much as possible.  Do not use towels, razors, toothbrushes, bedding, or other items that will be used by others.  Clean surfaces regularly to remove germs (disinfection). Use products or solutions that contain bleach. Make sure you disinfect bathroom surfaces, food preparation areas, and doorknobs. Follow these instructions at home:  Take over-the-counter and prescription medicines only as told by your health care provider.  Tell all health care providers who care for you that you have MRSA or that you have had MRSA.  If you are breastfeeding, talk to your health care provider about MRSA. You may be asked to temporarily stop breastfeeding.  If you have an invasive medical device, make sure that you know how to take care of it to prevent infection.  Keep all follow-up visits as told by your health care provider. This is important. Contact a health care provider if:  Your infection seems to be getting worse. Signs may include: ? Warmth, redness, or tenderness around your  wound site. ? A red line that spreads from your infection site. ? A dark color in the area around your infection. ? Wound drainage that is tan, yellow, or green. ? A bad smell coming from your wound.  You have symptoms of a new MRSA infection: ? A pus-filled pimple. ? A boil on your skin. ? Pus draining from your skin. ? A sore (abscess) under your skin or somewhere in your body. ? Fever with or without chills. Get help right away if:  You have trouble breathing.  You feel nauseous, you vomit, or you cannot take medicine without vomiting.  You have chest pain. These symptoms may represent a serious problem that is an emergency. Do not wait to see if the symptoms will go away. Get medical help right away. Call your local emergency services (911 in the [place] Do not drive yourself to the hospital. Summary  Self-care is important  after being diagnosed with MRSA. This will help you heal well and prevent the infection from spreading to others.  Wash your hands often, avoid close contact with others, and avoid sharing towels, razors, toothbrushes, and bedding with other people. This will help prevent the infection from spreading to others.  If you are being treated for a MRSA infection, make sure to take over-the-counter and prescription medicines as told. Finish all antibiotic medicine even if you start to feel better.  If you have a wound, follow instructions from your health care provider about how to take care of it. Check your wound every day for signs of infection. This information is not intended to replace advice given to you by your health care provider. Make sure you discuss any questions you have with your health care provider. Document Revised: 08/27/2018 Document Reviewed: 08/28/2018 Elsevier Patient Education  2020 [employer] Impetigo, Adult Impetigo is an infection of the skin. It commonly occurs in young children, but it can also occur in adults. The infection causes  itchy blisters and sores that produce brownish-yellow fluid. As the fluid dries, it forms a thick, honey-colored crust. These skin changes usually occur on the face, but they can also affect other areas of the body. Impetigo usually goes away in 7-10 days with treatment. What are the causes? This condition is caused by two types of bacteria. It may be caused by staphylococci or streptococci bacteria. These bacteria cause impetigo when they get under the surface of the skin. This often happens after some damage to the skin, such as:  Cuts, scrapes, or scratches.  Rashes.  Insect bites, especially when you scratch the area of a bite.  Chickenpox or other illnesses that cause open skin sores.  Nail biting or chewing. Impetigo can spread easily from one person to another (is contagious). It may be spread through close skin contact or by sharing towels, clothing, or other items that an infected person has touched. What increases the risk? The following factors may make you more likely to develop this condition:  Playing sports that include skin-to-skin contact with others.  Having a skin condition with open sores, such as chickenpox.  Having diabetes.  Having a weak body defense system (immune system).  Having many skin cuts or scrapes.  Living in an area that has high humidity levels.  Having poor hygiene.  Having high levels of staphylococci in your nose. What are the signs or symptoms? The main symptom of this condition is small blisters, often on the face around the mouth and nose. In time, the blisters break open and turn into tiny sores (lesions) with a yellow crust. In some cases, the blisters cause itching or burning. With scratching, irritation, or lack of treatment, these small lesions may get larger. Other possible symptoms include:  Larger blisters.  Pus.  Swollen lymph glands. Scratching the affected area can cause impetigo to spread to other parts of the body. The  bacteria can get under the fingernails and spread when you touch another area of your skin. How is this diagnosed? This condition is usually diagnosed during a physical exam. A skin sample or a sample of fluid from a blister may be taken for lab tests that involve growing bacteria (culture test). Lab tests can help to confirm the diagnosis or help to determine the best treatment. How is this treated? Treatment for this condition depends on the severity of the condition:  Mild impetigo can be treated with prescription antibiotic cream.  Oral  antibiotic medicine may be used in more severe cases.  Medicines that reduce itchiness (antihistamines)may also be used. Follow these instructions at home: Medicines  Take over-the-counter and prescription medicines only as told by your health care provider.  Apply or take your antibiotic as told by your health care provider. Do not stop using the antibiotic even if your condition improves. General instructions   To help prevent impetigo from spreading to other body areas: ? Keep your fingernails short and clean. ? Do not scratch the blisters or sores. ? Cover infected areas, if necessary, to keep from scratching. ? Wash your hands often with soap and warm water.  Before applying antibiotic cream or ointment, you should: ? Gently wash the infected areas with antibacterial soap and warm water. ? Soak crusted areas in warm, soapy water using antibacterial soap. ? Gently rub the areas to remove crusts. Do not scrub.  Do not share towels.  Wash your clothing and bedsheets in warm water that is 140F (60C) or warmer.  Stay home until you have used an antibiotic cream for 48 hours (2 days) or an oral antibiotic medicine for 24 hours (1 day). You should only return to work and activities with other people if your skin shows significant improvement. ? You may return to contact sports after you have used antibiotic medicine for 72 hours (3  days).  Keep all follow-up visits as told by your health care provider. This is important. How is this prevented?  Wash your hands often with soap and warm water.  Do not share towels, washcloths, clothing, bedding, or razors.  Keep your fingernails short.  Keep any cuts, scrapes, bug bites, or rashes clean and covered.  Use insect repellent to prevent bug bites. Contact a health care provider if:  You develop more blisters or sores even with treatment.  Other family members get sores.  Your skin sores are not improving after 72 hours (3 days) of treatment.  You have a fever. Get help right away if:  You see spreading redness or swelling of the skin around your sores.  You see red streaks coming from your sores.  You develop a sore throat.  The area around your rash becomes warm, red, or tender to the touch.  You have dark, reddish-brown urine.  You do not urinate often or you urinate small amounts.  You are very tired (lethargic).  You have swelling in the face, hands, or feet. Summary  Impetigo is a skin infection that causes itchy blisters and sores that produce brownish-yellow fluid. As the fluid dries, it forms a crust.  This condition is caused by staphylococci or streptococci bacteria. These bacteria cause impetigo when they get under the surface of the skin, such as through cuts, rashes, bug bites, or open sores.  Treatment for this condition may include antibiotic ointment or oral antibiotics.  To help prevent impetigo from spreading to other body areas, make sure you keep your fingernails short, avoid scratching, cover any blisters, and wash your hands often.  If you have impetigo, stay home until you have used an antibiotic cream for 48 hours (2 days) or an oral antibiotic medicine for 24 hours (1 day). You should only return to work and activities with other people if your skin shows significant improvement. This information is not intended to replace  advice given to you by your health care provider. Make sure you discuss any questions you have with your health care provider. Document Revised: 07/21/2018 Document Reviewed: 07/02/2016  Elsevier Patient Education  [employer].

## 2019-07-14 NOTE — Progress Notes (Signed)
Date:  07/14/2019   Name:  Heidi Johnston   DOB:  [birth year]   MRN:  [id number]   Chief Complaint: place on chin (came up about 6 weeks ago- was put on cephalexin. Went away, but came back x 3 days ago- red and irritated looking)  Rash This is a new (for impetigo) problem. The current episode started in the past 7 days (recurrence from 6 weeks ago). The problem has been gradually worsening since onset. The affected locations include the face. The rash is characterized by redness and pain (honey crusting). She was exposed to nothing. Pertinent negatives include no congestion, cough, diarrhea, eye pain, fatigue, fever, rhinorrhea, shortness of breath, sore throat or vomiting. Treatments tried: antibiotic. The treatment provided moderate (but recurrence) relief.    Lab Results  Component Value Date   CREATININE 0.94 07/14/2018   BUN 10 07/14/2018   NA 138 07/14/2018   K 4.2 07/14/2018   CL 101 07/14/2018   CO2 23 07/14/2018   Lab Results  Component Value Date   CHOL 226 (H) 07/14/2018   HDL 61 07/14/2018   LDLCALC 140 (H) 07/14/2018   TRIG 125 07/14/2018   CHOLHDL 3.7 07/14/2018   Lab Results  Component Value Date   TSH 2.840 07/14/2018   No results found for: HGBA1C   Review of Systems  Constitutional: Negative.  Negative for chills, fatigue, fever and unexpected weight change.  HENT: Negative for congestion, ear discharge, ear pain, rhinorrhea, sinus pressure, sneezing and sore throat.   Eyes: Negative for photophobia, pain, discharge, redness and itching.  Respiratory: Negative for cough, shortness of breath, wheezing and stridor.   Gastrointestinal: Negative for abdominal pain, blood in stool, constipation, diarrhea, nausea and vomiting.  Endocrine: Negative for cold intolerance, heat intolerance, polydipsia, polyphagia and polyuria.  Genitourinary: Negative for dysuria, flank pain, frequency, hematuria, menstrual problem, pelvic pain, urgency, vaginal bleeding and  vaginal discharge.  Musculoskeletal: Negative for arthralgias, back pain and myalgias.  Skin: Positive for rash.  Allergic/Immunologic: Negative for environmental allergies and food allergies.  Neurological: Negative for dizziness, weakness, light-headedness, numbness and headaches.  Hematological: Negative for adenopathy. Does not bruise/bleed easily.  Psychiatric/Behavioral: Negative for dysphoric mood. The patient is not nervous/anxious.     Patient Active Problem List   Diagnosis Date Noted  . 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 Anxiety 08/25/2018    No Known Allergies  Past Surgical History:  Procedure Laterality Date  . LAPAROSCOPY     endometriosis  . TONSILLECTOMY  2015    Social History   Tobacco Use  . Smoking status: Never Smoker  . Smokeless tobacco: Never Used  Substance Use Topics  . Alcohol use: Never  . Drug use: Never     Medication list has been reviewed and updated.  Current Meds  Medication Sig  . Norethindrone-Ethinyl Estradiol-Fe Biphas (LO LOESTRIN FE) 1 MG-10 MCG / 10 MCG tablet Take 1 tablet by mouth daily.  . sertraline (ZOLOFT) 100 MG tablet Take 1 tablet (100 mg total) by mouth daily.  . [DISCONTINUED] mupirocin ointment (BACTROBAN) 2 % Apply 1 application topically 2 (two) times daily.    PHQ 2/9 Scores 07/14/2019 01/22/2019 08/25/2018 07/14/2018  PHQ - 2 Score 0 0 3 4  PHQ- 9 Score 0 2 7 17     BP Readings from Last 3 Encounters:  07/14/19 120/64  01/22/19 134/76  08/25/18 120/80    Physical Exam Vitals and nursing note reviewed.  Constitutional:      General: She  is not in acute distress.    Appearance: She is not diaphoretic.  HENT:     Head: Normocephalic and atraumatic.     Right Ear: External ear normal.     Left Ear: External ear normal.     Nose: Nose normal.  Eyes:     General:        Right eye: No discharge.        Left eye: No discharge.     Conjunctiva/sclera: Conjunctivae normal.     Pupils: Pupils are equal,  round, and reactive to light.  Neck:     Thyroid: No thyromegaly.     Vascular: No JVD.  Cardiovascular:     Rate and Rhythm: Normal rate and regular rhythm.     Heart sounds: Normal heart sounds. No murmur. No friction rub. No gallop.   Pulmonary:     Effort: Pulmonary effort is normal.     Breath sounds: Normal breath sounds. No wheezing or rhonchi.  Abdominal:     General: Bowel sounds are normal.     Palpations: Abdomen is soft. There is no mass.     Tenderness: There is no abdominal tenderness. There is no guarding.  Musculoskeletal:        General: Normal range of motion.     Cervical back: Normal range of motion and neck supple.  Lymphadenopathy:     Cervical: No cervical adenopathy.  Skin:    General: Skin is warm and dry.     Findings: Erythema present.     Comments: Crusting noted  Neurological:     Mental Status: She is alert.     Deep Tendon Reflexes: Reflexes are normal and symmetric.     Wt Readings from Last 3 Encounters:  07/14/19 266 lb (120.7 kg)  01/22/19 250 lb (113.4 kg)  08/25/18 243 lb (110.2 kg)    BP 120/64   Pulse 68   Ht 5\' 8"  (1.727 m)   Wt 266 lb (120.7 kg)   LMP 07/13/2019 (Exact Date)   BMI 40.45 kg/m   Assessment and Plan:  1. Impetigo Recurrence.  Acute.  Patient has a similar occurrence about 5 to 6 weeks ago which resolved on Keflex 500 mg 4 times a day for 5 days.  Perhaps did not completely go away and then started to recur more so in the last couple days.  At this time we will retreat but this time will cover with Augmentin the for coverage of MRSA.  We will also prescribe Bactroban ointment to be applied twice a day to area.  Discussed perhaps being a staph carrier and using a antibacterial body wash and occasional nasal antibiotic ointment. - amoxicillin-clavulanate (AUGMENTIN) 875-125 MG tablet; Take 1 tablet by mouth 2 (two) times daily.  Dispense: 20 tablet; Refill: 0 - mupirocin ointment (BACTROBAN) 2 %; Apply 1 application  topically 2 (two) times daily.  Dispense: 22 g; Refill: 0  2. History of candidiasis Patient has a history of candidiasis that is status post antibiotic treatment.  Will prescribe Diflucan 150 mg on an as-needed basis. - fluconazole (DIFLUCAN) 150 MG tablet; Take 1 tablet (150 mg total) by mouth once for 1 dose.  Dispense: 1 tablet; Refill: 0

## 2019-07-27 ENCOUNTER — Ambulatory Visit: Payer: BC Managed Care – PPO | Admitting: Family Medicine

## 2019-08-11 ENCOUNTER — Encounter: Payer: Self-pay | Admitting: Family Medicine

## 2019-08-11 ENCOUNTER — Other Ambulatory Visit: Payer: Self-pay

## 2019-08-11 ENCOUNTER — Ambulatory Visit (INDEPENDENT_AMBULATORY_CARE_PROVIDER_SITE_OTHER): Payer: BC Managed Care – PPO | Admitting: Family Medicine

## 2019-08-11 VITALS — BP 120/86 | HR 84 | Ht 68.0 in | Wt 270.0 lb

## 2019-08-11 DIAGNOSIS — F419 Anxiety disorder, unspecified: Secondary | ICD-10-CM

## 2019-08-11 DIAGNOSIS — E7801 Familial hypercholesterolemia: Secondary | ICD-10-CM

## 2019-08-11 DIAGNOSIS — F329 Major depressive disorder, single episode, unspecified: Secondary | ICD-10-CM | POA: Diagnosis not present

## 2019-08-11 DIAGNOSIS — R635 Abnormal weight gain: Secondary | ICD-10-CM

## 2019-08-11 DIAGNOSIS — Z8639 Personal history of other endocrine, nutritional and metabolic disease: Secondary | ICD-10-CM

## 2019-08-11 DIAGNOSIS — R21 Rash and other nonspecific skin eruption: Secondary | ICD-10-CM

## 2019-08-11 MED ORDER — SERTRALINE HCL 100 MG PO TABS: 100.0000 mg | ORAL_TABLET | Freq: Every day | ORAL | 1 refills | Status: DC

## 2019-08-11 NOTE — Progress Notes (Signed)
Date:  08/11/2019   Name:  Heidi Johnston   DOB:  October 21, 1985   MRN:  [id number]   Chief Complaint: Depression (PHQ9=1 and GAD7=0) and Hyperlipidemia (had elevated total and LDL last year- needs recheck)  Depression        This is a chronic problem.  The current episode started more than 1 year ago.   The onset quality is gradual.   The problem occurs intermittently.  The problem has been gradually improving since onset.  Associated symptoms include no decreased concentration, no fatigue, no helplessness, no hopelessness, does not have insomnia, not irritable, no restlessness, no decreased interest, no appetite change, no body aches, no myalgias, no headaches, no indigestion, not sad and no suicidal ideas.  Past treatments include SSRIs - Selective serotonin reuptake inhibitors.  Compliance with treatment is variable.  Previous treatment provided moderate relief.   Pertinent negatives include no hypothyroidism. Hyperlipidemia This is a chronic problem. The current episode started more than 1 year ago. The problem is controlled. Recent lipid tests were reviewed and are normal. She has no history of chronic renal disease, diabetes, hypothyroidism, liver disease, obesity or nephrotic syndrome. There are no known factors aggravating her hyperlipidemia. Pertinent negatives include no chest pain, focal sensory loss, focal weakness, leg pain, myalgias or shortness of breath. Current antihyperlipidemic treatment includes diet change. The current treatment provides moderate improvement of lipids. There are no compliance problems.     Lab Results  Component Value Date   CREATININE 0.94 07/14/2018   BUN 10 07/14/2018   NA 138 07/14/2018   K 4.2 07/14/2018   CL 101 07/14/2018   CO2 23 07/14/2018   Lab Results  Component Value Date   CHOL 226 (H) 07/14/2018   HDL 61 07/14/2018   LDLCALC 140 (H) 07/14/2018   TRIG 125 07/14/2018   CHOLHDL 3.7 07/14/2018   Lab Results  Component Value Date   TSH  2.840 07/14/2018   No results found for: HGBA1C   Review of Systems  Constitutional: Negative.  Negative for appetite change, chills, fatigue, fever and unexpected weight change.  HENT: Negative for congestion, ear discharge, ear pain, rhinorrhea, sinus pressure, sneezing and sore throat.   Eyes: Negative for photophobia, pain, discharge, redness and itching.  Respiratory: Negative for cough, shortness of breath, wheezing and stridor.   Cardiovascular: Negative for chest pain.  Gastrointestinal: Negative for abdominal pain, blood in stool, constipation, diarrhea, nausea and vomiting.  Endocrine: Negative for cold intolerance, heat intolerance, polydipsia, polyphagia and polyuria.  Genitourinary: Negative for dysuria, flank pain, frequency, hematuria, menstrual problem, pelvic pain, urgency, vaginal bleeding and vaginal discharge.  Musculoskeletal: Negative for arthralgias, back pain and myalgias.  Skin: Negative for rash.  Allergic/Immunologic: Negative for environmental allergies and food allergies.  Neurological: Negative for dizziness, focal weakness, weakness, light-headedness, numbness and headaches.  Hematological: Negative for adenopathy. Does not bruise/bleed easily.  Psychiatric/Behavioral: Positive for depression. Negative for decreased concentration, dysphoric mood and suicidal ideas. The patient is not nervous/anxious and does not have insomnia.     Patient Active Problem List   Diagnosis Date Noted  . 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 Anxiety 08/25/2018    No Known Allergies  Past Surgical History:  Procedure Laterality Date  . LAPAROSCOPY     endometriosis  . TONSILLECTOMY  2015    Social History   Tobacco Use  . Smoking status: Never Smoker  . Smokeless tobacco: Never Used  Substance Use Topics  . Alcohol use: Never  .  Drug use: Never     Medication list has been reviewed and updated.  Current Meds  Medication Sig  . mupirocin ointment (BACTROBAN) 2  % Apply 1 application topically 2 (two) times daily.  . Norethindrone-Ethinyl Estradiol-Fe Biphas (LO LOESTRIN FE) 1 MG-10 MCG / 10 MCG tablet Take 1 tablet by mouth daily.  . sertraline (ZOLOFT) 100 MG tablet Take 1 tablet (100 mg total) by mouth daily.    PHQ 2/9 Scores 08/11/2019 07/14/2019 01/22/2019 08/25/2018  PHQ - 2 Score 0 0 0 3  PHQ- 9 Score 1 0 2 7    BP Readings from Last 3 Encounters:  08/11/19 120/86  07/14/19 120/64  01/22/19 134/76    Physical Exam Vitals and nursing note reviewed.  Constitutional:      General: She is not irritable.She is not in acute distress.    Appearance: She is not diaphoretic.  HENT:     Head: Normocephalic and atraumatic.     Right Ear: Tympanic membrane, ear canal and external ear normal.     Left Ear: Tympanic membrane, ear canal and external ear normal.     Nose: Nose normal. No congestion or rhinorrhea.  Eyes:     General:        Right eye: No discharge.        Left eye: No discharge.     Conjunctiva/sclera: Conjunctivae normal.     Pupils: Pupils are equal, round, and reactive to light.  Neck:     Thyroid: No thyromegaly.     Vascular: No JVD.  Cardiovascular:     Rate and Rhythm: Normal rate and regular rhythm.     Pulses: Normal pulses.     Heart sounds: Normal heart sounds. No murmur. No friction rub. No gallop.   Pulmonary:     Effort: Pulmonary effort is normal.     Breath sounds: Normal breath sounds. No rhonchi or rales.  Abdominal:     General: Bowel sounds are normal.     Palpations: Abdomen is soft. There is no mass.     Tenderness: There is no abdominal tenderness. There is no guarding.  Musculoskeletal:        General: Normal range of motion.     Cervical back: Normal range of motion and neck supple.  Lymphadenopathy:     Cervical: No cervical adenopathy.  Skin:    General: Skin is warm and dry.     Findings: No erythema.  Neurological:     Mental Status: She is alert.     Deep Tendon Reflexes: Reflexes are  normal and symmetric.     Wt Readings from Last 3 Encounters:  08/11/19 270 lb (122.5 kg)  07/14/19 266 lb (120.7 kg)  01/22/19 250 lb (113.4 kg)    BP 120/86   Pulse 84   Ht 5\' 8"  (1.727 m)   Wt 270 lb (122.5 kg)   LMP 07/13/2019 (Exact Date)   BMI 41.05 kg/m   Assessment and Plan:  1. Anxiety Chronic.  Stable.  Controlled.  Gad score 0.  Continue sertraline 100 mg once a day. - sertraline (ZOLOFT) 100 MG tablet; Take 1 tablet (100 mg total) by mouth daily.  Dispense: 90 tablet; Refill: 1  2. Reactive depression Chronic.  Controlled.  Stable.  PHQ score 0.  Continue sertraline 100 mg once a day. - sertraline (ZOLOFT) 100 MG tablet; Take 1 tablet (100 mg total) by mouth daily.  Dispense: 90 tablet; Refill: 1  3. Weight gain Relative  recent weight gain since last seen of about 20 pounds from July.  Will check TSH to evaluate for fatigue and possibility of insufficient thyroid production. - TSH  4. Familial hypercholesterolemia Chronic.  Controlled.  Stable.  This is continuing to be controlled with diet will check lipid panel. - Lipid Panel With LDL/HDL Ratio  5. Hx of non anemic vitamin B12 deficiency Chronic.  Controlled.  Stable.  Patient has history as noted above B12 deficiency.  We will check B12 level and if necessary CBC to assess for anemia. - B12  6. Rash Patient has facial rash particularly of the chin area in the malar area consistent with possible rosacea.  Will refer to dermatology for evaluation. - Ambulatory referral to Dermatology

## 2019-08-12 LAB — LIPID PANEL WITH LDL/HDL RATIO
Cholesterol, Total: 194 mg/dL (ref 100–199)
HDL: 63 mg/dL (ref >39)
LDL Chol Calc (NIH): 101 mg/dL — ABNORMAL HIGH (ref 0–99)
LDL/HDL Ratio: 1.6 ratio (ref 0.0–3.2)
Triglycerides: 175 mg/dL — ABNORMAL HIGH (ref 0–149)
VLDL Cholesterol Cal: 30 mg/dL (ref 5–40)

## 2019-08-12 LAB — TSH: TSH: 2.38 u[IU]/mL (ref 0.450–4.500)

## 2019-08-12 LAB — VITAMIN B12: Vitamin B-12: 274 pg/mL (ref 232–1245)

## 2019-08-17 ENCOUNTER — Other Ambulatory Visit: Payer: Self-pay | Admitting: Family Medicine

## 2019-08-17 DIAGNOSIS — F419 Anxiety disorder, unspecified: Secondary | ICD-10-CM

## 2019-08-17 DIAGNOSIS — F329 Major depressive disorder, single episode, unspecified: Secondary | ICD-10-CM

## 2019-08-30 ENCOUNTER — Ambulatory Visit: Payer: BC Managed Care – PPO | Admitting: Family Medicine

## 2019-08-30 ENCOUNTER — Encounter: Payer: Self-pay | Admitting: Family Medicine

## 2019-08-30 ENCOUNTER — Other Ambulatory Visit: Payer: Self-pay

## 2019-08-30 VITALS — BP 130/80 | HR 72 | Temp 98.6°F | Ht 68.0 in | Wt 270.0 lb

## 2019-08-30 DIAGNOSIS — J029 Acute pharyngitis, unspecified: Secondary | ICD-10-CM

## 2019-08-30 DIAGNOSIS — J0100 Acute maxillary sinusitis, unspecified: Secondary | ICD-10-CM

## 2019-08-30 MED ORDER — AMOXICILLIN-POT CLAVULANATE 875-125 MG PO TABS: 1.0000 | ORAL_TABLET | Freq: Two times a day (BID) | ORAL | 0 refills | Status: DC

## 2019-08-30 NOTE — Progress Notes (Signed)
Date:  08/30/2019   Name:  Heidi Johnston   DOB:  10-Feb-1986   MRN:  [id number]   Chief Complaint: Sore Throat (no other symptoms- started this am/ no drainage)  Sore Throat  This is a new problem. The current episode started today. The problem has been gradually worsening. Neither side of throat is experiencing more pain than the other. There has been no fever. The pain is at a severity of 6/10. The pain is moderate. Pertinent negatives include no abdominal pain, congestion, coughing, diarrhea, drooling, ear discharge, ear pain, headaches, hoarse voice, shortness of breath, stridor, swollen glands or vomiting. She has had no exposure to strep or mono. She has tried nothing for the symptoms.    Lab Results  Component Value Date   CREATININE 0.94 07/14/2018   BUN 10 07/14/2018   NA 138 07/14/2018   K 4.2 07/14/2018   CL 101 07/14/2018   CO2 23 07/14/2018   Lab Results  Component Value Date   CHOL 194 08/11/2019   HDL 63 08/11/2019   LDLCALC 101 (H) 08/11/2019   TRIG 175 (H) 08/11/2019   CHOLHDL 3.7 07/14/2018   Lab Results  Component Value Date   TSH 2.380 08/11/2019   No results found for: HGBA1C   Review of Systems  Constitutional: Negative.  Negative for chills, fatigue, fever and unexpected weight change.  HENT: Negative for congestion, drooling, ear discharge, ear pain, hoarse voice, nosebleeds, rhinorrhea, sinus pressure, sneezing and sore throat.   Eyes: Negative for photophobia, pain, discharge, redness and itching.  Respiratory: Negative for cough, shortness of breath, wheezing and stridor.   Cardiovascular: Negative for palpitations and leg swelling.  Gastrointestinal: Negative for abdominal pain, blood in stool, constipation, diarrhea, nausea and vomiting.  Endocrine: Negative for cold intolerance, heat intolerance, polydipsia, polyphagia and polyuria.  Genitourinary: Negative for dysuria, flank pain, frequency, hematuria, menstrual problem, pelvic pain,  urgency, vaginal bleeding and vaginal discharge.  Musculoskeletal: Negative for arthralgias, back pain and myalgias.  Skin: Negative for rash.  Allergic/Immunologic: Negative for environmental allergies and food allergies.  Neurological: Negative for dizziness, weakness, light-headedness, numbness and headaches.  Hematological: Negative for adenopathy. Does not bruise/bleed easily.  Psychiatric/Behavioral: Negative for dysphoric mood. The patient is not nervous/anxious.     Patient Active Problem List   Diagnosis Date Noted  . 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 Anxiety 08/25/2018    No Known Allergies  Past Surgical History:  Procedure Laterality Date  . LAPAROSCOPY     endometriosis  . TONSILLECTOMY  2015    Social History   Tobacco Use  . Smoking status: Never Smoker  . Smokeless tobacco: Never Used  Substance Use Topics  . Alcohol use: Never  . Drug use: Never     Medication list has been reviewed and updated.  Current Meds  Medication Sig  . Norethindrone-Ethinyl Estradiol-Fe Biphas (LO LOESTRIN FE) 1 MG-10 MCG / 10 MCG tablet Take 1 tablet by mouth daily.  . sertraline (ZOLOFT) 100 MG tablet TAKE 1 TABLET BY MOUTH EVERY DAY    PHQ 2/9 Scores 08/30/2019 08/11/2019 07/14/2019 01/22/2019  PHQ - 2 Score 0 0 0 0  PHQ- 9 Score 1 1 0 2    BP Readings from Last 3 Encounters:  08/30/19 130/80  08/11/19 120/86  07/14/19 120/64    Physical Exam Vitals and nursing note reviewed.  Constitutional:      Appearance: She is well-developed.  HENT:     Head: Normocephalic.  Right Ear: Tympanic membrane, ear canal and external ear normal.     Left Ear: Tympanic membrane and external ear normal.     Nose: Congestion present. No rhinorrhea.     Mouth/Throat:     Mouth: Mucous membranes are moist.     Pharynx: Pharyngeal swelling and posterior oropharyngeal erythema present. No oropharyngeal exudate.  Eyes:     General: Lids are everted, no foreign bodies appreciated. No  scleral icterus.       Left eye: No foreign body or hordeolum.     Conjunctiva/sclera: Conjunctivae normal.     Right eye: Right conjunctiva is not injected.     Left eye: Left conjunctiva is not injected.     Pupils: Pupils are equal, round, and reactive to light.  Neck:     Thyroid: No thyromegaly.     Vascular: No JVD.     Trachea: No tracheal deviation.  Cardiovascular:     Rate and Rhythm: Normal rate and regular rhythm.     Heart sounds: Normal heart sounds. No murmur. No friction rub. No gallop.   Pulmonary:     Effort: Pulmonary effort is normal. No respiratory distress.     Breath sounds: Normal breath sounds. No decreased breath sounds, wheezing, rhonchi or rales.  Abdominal:     General: Bowel sounds are normal.     Palpations: Abdomen is soft. There is no mass.     Tenderness: There is no abdominal tenderness. There is no guarding or rebound.  Musculoskeletal:        General: No tenderness. Normal range of motion.     Cervical back: Normal range of motion and neck supple.  Lymphadenopathy:     Head:     Right side of head: Tonsillar adenopathy present. No submental or submandibular adenopathy.     Left side of head: Tonsillar adenopathy present. No submental or submandibular adenopathy.     Cervical: No cervical adenopathy.     Upper Body:     Right upper body: No supraclavicular adenopathy.     Left upper body: No supraclavicular adenopathy.  Skin:    General: Skin is warm.     Findings: No rash.  Neurological:     Mental Status: She is alert and oriented to person, place, and time.     Cranial Nerves: No cranial nerve deficit.     Deep Tendon Reflexes: Reflexes normal.  Psychiatric:        Mood and Affect: Mood is not anxious or depressed.     Wt Readings from Last 3 Encounters:  08/30/19 270 lb (122.5 kg)  08/11/19 270 lb (122.5 kg)  07/14/19 266 lb (120.7 kg)    BP 130/80   Pulse 72   Temp 98.6 F (37 C) (Oral)   Ht 5\' 8"  (1.727 m)   Wt 270 lb  (122.5 kg)   LMP 08/08/2019 (Approximate)   BMI 41.05 kg/m   Assessment and Plan: 1. Acute maxillary sinusitis, recurrence not specified Acute.  Persistent.  Gradually worsening.  Exam is consistent with an acute maxillary sinusitis.  Will initiate Augmentin 875 mg twice a day. - amoxicillin-clavulanate (AUGMENTIN) 875-125 MG tablet; Take 1 tablet by mouth 2 (two) times daily.  Dispense: 20 tablet; Refill: 0  2. Pharyngitis, unspecified etiology There is also noted to be erythema and swelling of the left tonsillar pillar.  This could be consistent with a pharyngitis but will be taken covered by of by the Augmentin.

## 2019-10-29 ENCOUNTER — Other Ambulatory Visit: Payer: Self-pay

## 2019-10-29 ENCOUNTER — Encounter: Payer: Self-pay | Admitting: Family Medicine

## 2019-10-29 ENCOUNTER — Ambulatory Visit: Payer: BC Managed Care – PPO | Admitting: Family Medicine

## 2019-10-29 VITALS — BP 120/70 | HR 72 | Ht 68.0 in | Wt 276.0 lb

## 2019-10-29 DIAGNOSIS — J029 Acute pharyngitis, unspecified: Secondary | ICD-10-CM

## 2019-10-29 MED ORDER — DOXYCYCLINE HYCLATE 100 MG PO TABS: 100.0000 mg | ORAL_TABLET | Freq: Two times a day (BID) | ORAL | 0 refills | Status: DC

## 2019-10-29 NOTE — Progress Notes (Signed)
Date:  10/29/2019   Name:  Heidi Johnston   DOB:  12-May-1986   MRN:  [id number]   Chief Complaint: Sore Throat (hurts to swallow x 2 days- nothing otc)  Sore Throat  This is a new problem. The current episode started in the past 7 days (wednesday). The problem has been gradually worsening. The pain is worse on the right side. There has been no fever. The pain is mild. Pertinent negatives include no abdominal pain, congestion, coughing, diarrhea, ear discharge, ear pain, headaches, shortness of breath, stridor or vomiting. She has had no exposure to strep or mono. She has tried nothing for the symptoms.    Lab Results  Component Value Date   CREATININE 0.94 07/14/2018   BUN 10 07/14/2018   NA 138 07/14/2018   K 4.2 07/14/2018   CL 101 07/14/2018   CO2 23 07/14/2018   Lab Results  Component Value Date   CHOL 194 08/11/2019   HDL 63 08/11/2019   LDLCALC 101 (H) 08/11/2019   TRIG 175 (H) 08/11/2019   CHOLHDL 3.7 07/14/2018   Lab Results  Component Value Date   TSH 2.380 08/11/2019   No results found for: HGBA1C No results found for: WBC, HGB, HCT, MCV, PLT No results found for: ALT, AST, GGT, ALKPHOS, BILITOT   Review of Systems  Constitutional: Negative.  Negative for chills, diaphoresis, fatigue, fever and unexpected weight change.  HENT: Negative for congestion, ear discharge, ear pain, postnasal drip, rhinorrhea, sinus pressure, sneezing and sore throat.   Eyes: Negative for photophobia, pain, discharge, redness and itching.  Respiratory: Negative for cough, chest tightness, shortness of breath, wheezing and stridor.   Gastrointestinal: Negative for abdominal pain, blood in stool, constipation, diarrhea, nausea and vomiting.  Endocrine: Negative for cold intolerance, heat intolerance, polydipsia, polyphagia and polyuria.  Genitourinary: Negative for dysuria, flank pain, frequency, hematuria, menstrual problem, pelvic pain, urgency, vaginal bleeding and vaginal  discharge.  Musculoskeletal: Negative for arthralgias, back pain and myalgias.  Skin: Negative for rash.  Allergic/Immunologic: Negative for environmental allergies and food allergies.  Neurological: Negative for dizziness, weakness, light-headedness, numbness and headaches.  Hematological: Negative for adenopathy. Does not bruise/bleed easily.  Psychiatric/Behavioral: Negative for dysphoric mood. The patient is not nervous/anxious.     Patient Active Problem List   Diagnosis Date Noted  . 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 Anxiety 08/25/2018    No Known Allergies  Past Surgical History:  Procedure Laterality Date  . LAPAROSCOPY     endometriosis  . TONSILLECTOMY  2015    Social History   Tobacco Use  . Smoking status: Never Smoker  . Smokeless tobacco: Never Used  Substance Use Topics  . Alcohol use: Never  . Drug use: Never     Medication list has been reviewed and updated.  Current Meds  Medication Sig  . Norethindrone-Ethinyl Estradiol-Fe Biphas (LO LOESTRIN FE) 1 MG-10 MCG / 10 MCG tablet Take 1 tablet by mouth daily.  . sertraline (ZOLOFT) 100 MG tablet TAKE 1 TABLET BY MOUTH EVERY DAY    PHQ 2/9 Scores 10/29/2019 08/30/2019 08/11/2019 07/14/2019  PHQ - 2 Score 0 0 0 0  PHQ- 9 Score 0 1 1 0    BP Readings from Last 3 Encounters:  10/29/19 120/70  08/30/19 130/80  08/11/19 120/86    Physical Exam Vitals and nursing note reviewed.  Constitutional:      General: She is not in acute distress.    Appearance: She is not diaphoretic.  HENT:     Head: Normocephalic and atraumatic.     Right Ear: Tympanic membrane, ear canal and external ear normal.     Left Ear: Tympanic membrane, ear canal and external ear normal.     Nose: Nose normal. No congestion or rhinorrhea.     Mouth/Throat:     Mouth: Oral lesions present.     Pharynx: Posterior oropharyngeal erythema present. No oropharyngeal exudate.  Eyes:     General:        Right eye: No discharge.         Left eye: No discharge.     Conjunctiva/sclera: Conjunctivae normal.     Pupils: Pupils are equal, round, and reactive to light.  Neck:     Thyroid: No thyromegaly.     Vascular: No JVD.  Cardiovascular:     Rate and Rhythm: Normal rate and regular rhythm.     Heart sounds: Normal heart sounds. No murmur. No friction rub. No gallop.   Pulmonary:     Effort: Pulmonary effort is normal.     Breath sounds: Normal breath sounds.  Abdominal:     General: Bowel sounds are normal.     Palpations: Abdomen is soft. There is no mass.     Tenderness: There is no abdominal tenderness. There is no guarding.  Musculoskeletal:        General: Normal range of motion.     Cervical back: Normal range of motion and neck supple.  Lymphadenopathy:     Cervical: No cervical adenopathy.  Skin:    General: Skin is warm and dry.  Neurological:     Mental Status: She is alert.     Deep Tendon Reflexes: Reflexes are normal and symmetric.     Wt Readings from Last 3 Encounters:  10/29/19 276 lb (125.2 kg)  08/30/19 270 lb (122.5 kg)  08/11/19 270 lb (122.5 kg)    BP 120/70   Pulse 72   Ht 5\' 8"  (1.727 m)   Wt 276 lb (125.2 kg)   LMP 09/29/2019 (Approximate)   BMI 41.97 kg/m   Assessment and Plan: 1. Pharyngitis, unspecified etiology New onset.  Persistent.  Similar to a previous episode for which she was treated with a Zithromax and couple weeks ago.  This is not a continuance of this but a separate episode.  We will obtain a throat culture for exposure.  In the meantime we will treat with doxycycline 100 mg twice daily. - doxycycline (VIBRA-TABS) 100 MG tablet; Take 1 tablet (100 mg total) by mouth 2 (two) times daily.  Dispense: 20 tablet; Refill: 0 - [facility], Pharyngeal

## 2019-11-01 LAB — CT/GC NAA, PHARYNGEAL
C TRACH RRNA NPH QL PCR: NEGATIVE
N GONORRHOEA RRNA NPH QL PCR: NEGATIVE

## 2019-11-05 ENCOUNTER — Telehealth: Payer: Self-pay | Admitting: Family Medicine

## 2019-11-05 NOTE — Telephone Encounter (Signed)
Patient calling to request a RX for yeast infection called into pharmacy. Patient states she thinks it was caused by antibiotics prescribed on 05/07.

## 2019-11-08 ENCOUNTER — Other Ambulatory Visit: Payer: Self-pay

## 2019-11-08 DIAGNOSIS — T3695XA Adverse effect of unspecified systemic antibiotic, initial encounter: Secondary | ICD-10-CM

## 2019-11-08 MED ORDER — FLUCONAZOLE 150 MG PO TABS: 150.0000 mg | ORAL_TABLET | Freq: Once | ORAL | 0 refills | Status: AC

## 2019-11-08 NOTE — Progress Notes (Signed)
Sent in diflucan

## 2019-11-08 NOTE — Telephone Encounter (Signed)
Sent in diflucan

## 2020-01-12 ENCOUNTER — Ambulatory Visit: Payer: BC Managed Care – PPO | Admitting: Family Medicine

## 2020-01-24 ENCOUNTER — Encounter: Payer: Self-pay | Admitting: Family Medicine

## 2020-01-24 ENCOUNTER — Ambulatory Visit: Payer: BC Managed Care – PPO | Admitting: Family Medicine

## 2020-01-24 ENCOUNTER — Other Ambulatory Visit: Payer: Self-pay

## 2020-01-24 VITALS — BP 142/98 | HR 107 | Ht 68.0 in | Wt 279.0 lb

## 2020-01-24 DIAGNOSIS — F419 Anxiety disorder, unspecified: Secondary | ICD-10-CM | POA: Diagnosis not present

## 2020-01-24 DIAGNOSIS — F329 Major depressive disorder, single episode, unspecified: Secondary | ICD-10-CM

## 2020-01-24 DIAGNOSIS — Z8639 Personal history of other endocrine, nutritional and metabolic disease: Secondary | ICD-10-CM

## 2020-01-24 DIAGNOSIS — R030 Elevated blood-pressure reading, without diagnosis of hypertension: Secondary | ICD-10-CM

## 2020-01-24 MED ORDER — SERTRALINE HCL 100 MG PO TABS: 100.0000 mg | ORAL_TABLET | Freq: Every day | ORAL | 1 refills | Status: DC

## 2020-01-24 NOTE — Patient Instructions (Signed)
Managing Your Hypertension °Hypertension is commonly called high blood pressure. This is when the force of your blood pressing against the walls of your arteries is too strong. Arteries are blood vessels that carry blood from your heart throughout your body. Hypertension forces the heart to work harder to pump blood, and may cause the arteries to become narrow or stiff. Having untreated or uncontrolled hypertension can cause heart attack, stroke, kidney disease, and other problems. °What are blood pressure readings? °A blood pressure reading consists of a higher number over a lower number. Ideally, your blood pressure should be below 120/80. The first ("top") number is called the systolic pressure. It is a measure of the pressure in your arteries as your heart beats. The second ("bottom") number is called the diastolic pressure. It is a measure of the pressure in your arteries as the heart relaxes. °What does my blood pressure reading mean? °Blood pressure is classified into four stages. Based on your blood pressure reading, your health care provider may use the following stages to determine what type of treatment you need, if any. Systolic pressure and diastolic pressure are measured in a unit called mm Hg. °Normal °· Systolic pressure: below 120. °· Diastolic pressure: below 80. °Elevated °· Systolic pressure: 120-129. °· Diastolic pressure: below 80. °Hypertension stage 1 °· Systolic pressure: 130-139. °· Diastolic pressure: 80-89. °Hypertension stage 2 °· Systolic pressure: 140 or above. °· Diastolic pressure: 90 or above. °What health risks are associated with hypertension? °Managing your hypertension is an important responsibility. Uncontrolled hypertension can lead to: °· A heart attack. °· A stroke. °· A weakened blood vessel (aneurysm). °· Heart failure. °· Kidney damage. °· Eye damage. °· Metabolic syndrome. °· Memory and concentration problems. °What changes can I make to manage my  hypertension? °Hypertension can be managed by making lifestyle changes and possibly by taking medicines. Your health care provider will help you make a plan to bring your blood pressure within a normal range. °Eating and drinking ° °· Eat a diet that is high in fiber and potassium, and low in salt (sodium), added sugar, and fat. An example eating plan is called the DASH (Dietary Approaches to Stop Hypertension) diet. To eat this way: °? Eat plenty of fresh fruits and vegetables. Try to fill half of your plate at each meal with fruits and vegetables. °? Eat whole grains, such as whole wheat pasta, brown rice, or whole grain bread. Fill about one quarter of your plate with whole grains. °? Eat low-fat diary products. °? Avoid fatty cuts of meat, processed or cured meats, and poultry with skin. Fill about one quarter of your plate with lean proteins such as fish, chicken without skin, beans, eggs, and tofu. °? Avoid premade and processed foods. These tend to be higher in sodium, added sugar, and fat. °· Reduce your daily sodium intake. Most people with hypertension should eat less than 1,500 mg of sodium a day. °· Limit alcohol intake to no more than 1 drink a day for nonpregnant women and 2 drinks a day for men. One drink equals 12 oz of beer, 5 oz of wine, or 1½ oz of hard liquor. °Lifestyle °· Work with your health care provider to maintain a healthy body weight, or to lose weight. Ask what an ideal weight is for you. °· Get at least 30 minutes of exercise that causes your heart to beat faster (aerobic exercise) most days of the week. Activities may include walking, swimming, or biking. °· Include exercise   to strengthen your muscles (resistance exercise), such as weight lifting, as part of your weekly exercise routine. Try to do these types of exercises for 30 minutes at least 3 days a week. °· Do not use any products that contain nicotine or tobacco, such as cigarettes and e-cigarettes. If you need help quitting,  ask your health care provider. °· Control any long-term (chronic) conditions you have, such as high cholesterol or diabetes. °Monitoring °· Monitor your blood pressure at home as told by your health care provider. Your personal target blood pressure may vary depending on your medical conditions, your age, and other factors. °· Have your blood pressure checked regularly, as often as told by your health care provider. °Working with your health care provider °· Review all the medicines you take with your health care provider because there may be side effects or interactions. °· Talk with your health care provider about your diet, exercise habits, and other lifestyle factors that may be contributing to hypertension. °· Visit your health care provider regularly. Your health care provider can help you create and adjust your plan for managing hypertension. °Will I need medicine to control my blood pressure? °Your health care provider may prescribe medicine if lifestyle changes are not enough to get your blood pressure under control, and if: °· Your systolic blood pressure is 130 or higher. °· Your diastolic blood pressure is 80 or higher. °Take medicines only as told by your health care provider. Follow the directions carefully. Blood pressure medicines must be taken as prescribed. The medicine does not work as well when you skip doses. Skipping doses also puts you at risk for problems. °Contact a health care provider if: °· You think you are having a reaction to medicines you have taken. °· You have repeated (recurrent) headaches. °· You feel dizzy. °· You have swelling in your ankles. °· You have trouble with your vision. °Get help right away if: °· You develop a severe headache or confusion. °· You have unusual weakness or numbness, or you feel faint. °· You have severe pain in your chest or abdomen. °· You vomit repeatedly. °· You have trouble breathing. °Summary °· Hypertension is when the force of blood pumping  through your arteries is too strong. If this condition is not controlled, it may put you at risk for serious complications. °· Your personal target blood pressure may vary depending on your medical conditions, your age, and other factors. For most people, a normal blood pressure is less than 120/80. °· Hypertension is managed by lifestyle changes, medicines, or both. Lifestyle changes include weight loss, eating a healthy, low-sodium diet, exercising more, and limiting alcohol. °This information is not intended to replace advice given to you by your health care provider. Make sure you discuss any questions you have with your health care provider. °Document Revised: 10/02/2018 Document Reviewed: 05/08/2016 °Elsevier Patient Education © 2020 [employer] ° °DASH Eating Plan °DASH stands for "Dietary Approaches to Stop Hypertension." The DASH eating plan is a healthy eating plan that has been shown to reduce high blood pressure (hypertension). It may also reduce your risk for type 2 diabetes, heart disease, and stroke. The DASH eating plan may also help with weight loss. °What are tips for following this plan? ° °General guidelines °· Avoid eating more than 2,300 mg (milligrams) of salt (sodium) a day. If you have hypertension, you may need to reduce your sodium intake to 1,500 mg a day. °· Limit alcohol intake to no more than   1 drink a day for nonpregnant women and 2 drinks a day for men. One drink equals 12 oz of beer, 5 oz of wine, or 1½ oz of hard liquor. °· Work with your health care provider to maintain a healthy body weight or to lose weight. Ask what an ideal weight is for you. °· Get at least 30 minutes of exercise that causes your heart to beat faster (aerobic exercise) most days of the week. Activities may include walking, swimming, or biking. °· Work with your health care provider or diet and nutrition specialist (dietitian) to adjust your eating plan to your individual calorie needs. °Reading food  labels ° °· Check food labels for the amount of sodium per serving. Choose foods with less than 5 percent of the Daily Value of sodium. Generally, foods with less than 300 mg of sodium per serving fit into this eating plan. °· To find whole grains, look for the word "whole" as the first word in the ingredient list. °Shopping °· Buy products labeled as "low-sodium" or "no salt added." °· Buy fresh foods. Avoid canned foods and premade or frozen meals. °Cooking °· Avoid adding salt when cooking. Use salt-free seasonings or herbs instead of table salt or sea salt. Check with your health care provider or pharmacist before using salt substitutes. °· Do not fry foods. Cook foods using healthy methods such as baking, boiling, grilling, and broiling instead. °· Cook with heart-healthy oils, such as olive, canola, soybean, or sunflower oil. °Meal planning °· Eat a balanced diet that includes: °? 5 or more servings of fruits and vegetables each day. At each meal, try to fill half of your plate with fruits and vegetables. °? Up to 6-8 servings of whole grains each day. °? Less than 6 oz of lean meat, poultry, or fish each day. A 3-oz serving of meat is about the same size as a deck of cards. One egg equals 1 oz. °? 2 servings of low-fat dairy each day. °? A serving of nuts, seeds, or beans 5 times each week. °? Heart-healthy fats. Healthy fats called Omega-3 fatty acids are found in foods such as flaxseeds and coldwater fish, like sardines, salmon, and mackerel. °· Limit how much you eat of the following: °? Canned or prepackaged foods. °? Food that is high in trans fat, such as fried foods. °? Food that is high in saturated fat, such as fatty meat. °? Sweets, desserts, sugary drinks, and other foods with added sugar. °? Full-fat dairy products. °· Do not salt foods before eating. °· Try to eat at least 2 vegetarian meals each week. °· Eat more home-cooked food and less restaurant, buffet, and fast food. °· When eating at a  restaurant, ask that your food be prepared with less salt or no salt, if possible. °What foods are recommended? °The items listed may not be a complete list. Talk with your dietitian about what dietary choices are best for you. °Grains °Whole-grain or whole-wheat bread. Whole-grain or whole-wheat pasta. Brown rice. Oatmeal. Quinoa. Bulgur. Whole-grain and low-sodium cereals. Pita bread. Low-fat, low-sodium crackers. Whole-wheat flour tortillas. °Vegetables °Fresh or frozen vegetables (raw, steamed, roasted, or grilled). Low-sodium or reduced-sodium tomato and vegetable juice. Low-sodium or reduced-sodium tomato sauce and tomato paste. Low-sodium or reduced-sodium canned vegetables. °Fruits °All fresh, dried, or frozen fruit. Canned fruit in natural juice (without added sugar). °Meat and other protein foods °Skinless chicken or turkey. Ground chicken or turkey. Pork with fat trimmed off. Fish and seafood. Egg whites.   Dried beans, peas, or lentils. Unsalted nuts, nut butters, and seeds. Unsalted canned beans. Lean cuts of beef with fat trimmed off. Low-sodium, lean deli meat. °Dairy °Low-fat (1%) or fat-free (skim) milk. Fat-free, low-fat, or reduced-fat cheeses. Nonfat, low-sodium ricotta or cottage cheese. Low-fat or nonfat yogurt. Low-fat, low-sodium cheese. °Fats and oils °Soft margarine without trans fats. Vegetable oil. Low-fat, reduced-fat, or light mayonnaise and salad dressings (reduced-sodium). Canola, safflower, olive, soybean, and sunflower oils. Avocado. °Seasoning and other foods °Herbs. Spices. Seasoning mixes without salt. Unsalted popcorn and pretzels. Fat-free sweets. °What foods are not recommended? °The items listed may not be a complete list. Talk with your dietitian about what dietary choices are best for you. °Grains °Baked goods made with fat, such as croissants, muffins, or some breads. Dry pasta or rice meal packs. °Vegetables °Creamed or fried vegetables. Vegetables in a cheese sauce.  Regular canned vegetables (not low-sodium or reduced-sodium). Regular canned tomato sauce and paste (not low-sodium or reduced-sodium). Regular tomato and vegetable juice (not low-sodium or reduced-sodium). Pickles. Olives. °Fruits °Canned fruit in a light or heavy syrup. Fried fruit. Fruit in cream or butter sauce. °Meat and other protein foods °Fatty cuts of meat. Ribs. Fried meat. Bacon. Sausage. Bologna and other processed lunch meats. Salami. Fatback. Hotdogs. Bratwurst. Salted nuts and seeds. Canned beans with added salt. Canned or smoked fish. Whole eggs or egg yolks. Chicken or turkey with skin. °Dairy °Whole or 2% milk, cream, and half-and-half. Whole or full-fat cream cheese. Whole-fat or sweetened yogurt. Full-fat cheese. Nondairy creamers. Whipped toppings. Processed cheese and cheese spreads. °Fats and oils °Butter. Stick margarine. Lard. Shortening. Ghee. Bacon fat. Tropical oils, such as coconut, palm kernel, or palm oil. °Seasoning and other foods °Salted popcorn and pretzels. Onion salt, garlic salt, seasoned salt, table salt, and sea salt. Worcestershire sauce. Tartar sauce. Barbecue sauce. Teriyaki sauce. Soy sauce, including reduced-sodium. Steak sauce. Canned and packaged gravies. Fish sauce. Oyster sauce. Cocktail sauce. Horseradish that you find on the shelf. Ketchup. Mustard. Meat flavorings and tenderizers. Bouillon cubes. Hot sauce and Tabasco sauce. Premade or packaged marinades. Premade or packaged taco seasonings. Relishes. Regular salad dressings. °Where to find more information: °· National Heart, Lung, and [employer] Institute: www.nhlbi.nih.gov °· American Heart Association: www.heart.org °Summary °· The DASH eating plan is a healthy eating plan that has been shown to reduce high blood pressure (hypertension). It may also reduce your risk for type 2 diabetes, heart disease, and stroke. °· With the DASH eating plan, you should limit salt (sodium) intake to 2,300 mg a day. If you have  hypertension, you may need to reduce your sodium intake to 1,500 mg a day. °· When on the DASH eating plan, aim to eat more fresh fruits and vegetables, whole grains, lean proteins, low-fat dairy, and heart-healthy fats. °· Work with your health care provider or diet and nutrition specialist (dietitian) to adjust your eating plan to your individual calorie needs. °This information is not intended to replace advice given to you by your health care provider. Make sure you discuss any questions you have with your health care provider. °Document Revised: 05/23/2017 Document Reviewed: 06/03/2016 °Elsevier Patient Education © 2020 [employer] ° °

## 2020-01-24 NOTE — Progress Notes (Signed)
Date:  01/24/2020   Name:  Heidi Johnston   DOB:  1986-02-14   MRN:  [id number]   Chief Complaint: Anxiety (follow up), Depression, and Hypertension  Anxiety Presents for follow-up visit. Symptoms include nervous/anxious behavior. Patient reports no chest pain, compulsions, confusion, decreased concentration, depressed mood, dizziness, dry mouth, excessive worry, feeling of choking, hyperventilation, impotence, insomnia, malaise, muscle tension, nausea, obsessions, palpitations, panic, restlessness, shortness of breath or suicidal ideas. Symptoms occur occasionally. The severity of symptoms is mild.   Her past medical history is significant for anemia.  Depression        This is a chronic problem.  The current episode started more than 1 year ago.   The onset quality is gradual.   The problem occurs intermittently.  The problem has been gradually improving since onset.  Associated symptoms include no decreased concentration, no fatigue, no helplessness, no hopelessness, does not have insomnia, not irritable, no restlessness, no decreased interest, no appetite change, no myalgias, no headaches, no indigestion, not sad and no suicidal ideas.     The symptoms are aggravated by nothing.  Past treatments include SSRIs - Selective serotonin reuptake inhibitors.  Compliance with treatment is good.  Previous treatment provided moderate relief.  Past medical history includes anxiety.   Hypertension This is a new problem. The current episode started today. The problem has been waxing and waning since onset. The problem is controlled. Associated symptoms include anxiety. Pertinent negatives include no blurred vision, chest pain, headaches, malaise/fatigue, neck pain, orthopnea, palpitations, peripheral edema, PND, shortness of breath or sweats. There are no associated agents to hypertension. Past treatments include lifestyle changes. The current treatment provides mild improvement. There are no compliance  problems.  There is no history of angina, kidney disease, CAD/MI, CVA, heart failure, left ventricular hypertrophy, PVD or retinopathy. There is no history of chronic renal disease, a hypertension causing med or renovascular disease.  Anemia Presents for follow-up visit. There has been no abdominal pain, bruising/bleeding easily, confusion, fever, malaise/fatigue or palpitations. Signs of blood loss that are not present include hematemesis, hematochezia, melena, menorrhagia and vaginal bleeding. There is no history of chronic renal disease or heart failure. There are no compliance problems.     Lab Results  Component Value Date   CREATININE 0.94 07/14/2018   BUN 10 07/14/2018   NA 138 07/14/2018   K 4.2 07/14/2018   CL 101 07/14/2018   CO2 23 07/14/2018   Lab Results  Component Value Date   CHOL 194 08/11/2019   HDL 63 08/11/2019   LDLCALC 101 (H) 08/11/2019   TRIG 175 (H) 08/11/2019   CHOLHDL 3.7 07/14/2018   Lab Results  Component Value Date   TSH 2.380 08/11/2019   No results found for: HGBA1C No results found for: WBC, HGB, HCT, MCV, PLT No results found for: ALT, AST, GGT, ALKPHOS, BILITOT   Review of Systems  Constitutional: Negative for appetite change, chills, fatigue, fever and malaise/fatigue.  HENT: Negative for drooling, ear discharge, ear pain and sore throat.   Eyes: Negative for blurred vision.  Respiratory: Negative for cough, shortness of breath and wheezing.   Cardiovascular: Negative for chest pain, palpitations, orthopnea, leg swelling and PND.  Gastrointestinal: Negative for abdominal pain, blood in stool, constipation, diarrhea, hematemesis, hematochezia, melena and nausea.  Endocrine: Negative for polydipsia.  Genitourinary: Negative for dysuria, frequency, hematuria, impotence, menorrhagia, urgency and vaginal bleeding.  Musculoskeletal: Negative for back pain, myalgias and neck pain.  Skin: Negative for rash.  Allergic/Immunologic: Negative for  environmental allergies.  Neurological: Negative for dizziness and headaches.  Hematological: Does not bruise/bleed easily.  Psychiatric/Behavioral: Positive for depression. Negative for confusion, decreased concentration and suicidal ideas. The patient is nervous/anxious. The patient does not have insomnia.     Patient Active Problem List   Diagnosis Date Noted  . 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 Anxiety 08/25/2018    No Known Allergies  Past Surgical History:  Procedure Laterality Date  . LAPAROSCOPY     endometriosis  . TONSILLECTOMY  2015    Social History   Tobacco Use  . Smoking status: Never Smoker  . Smokeless tobacco: Never Used  Substance Use Topics  . Alcohol use: Never  . Drug use: Never     Medication list has been reviewed and updated.  Current Meds  Medication Sig  . cyanocobalamin (,VITAMIN B-12,) 1000 MCG/ML injection Inject into the muscle.  . Norethindrone-Ethinyl Estradiol-Fe Biphas (LO LOESTRIN FE) 1 MG-10 MCG / 10 MCG tablet Take 1 tablet by mouth daily.  . sertraline (ZOLOFT) 100 MG tablet TAKE 1 TABLET BY MOUTH EVERY DAY    PHQ 2/9 Scores 01/24/2020 10/29/2019 08/30/2019 08/11/2019  PHQ - 2 Score 0 0 0 0  PHQ- 9 Score 1 0 1 1    GAD 7 : Generalized Anxiety Score 01/24/2020 10/29/2019 08/30/2019 08/11/2019  Nervous, Anxious, on Edge 1 0 0 0  Control/stop worrying 0 0 0 0  Worry too much - different things 0 0 0 0  Trouble relaxing 0 0 0 0  Restless 0 0 0 0  Easily annoyed or irritable 0 0 0 0  Afraid - awful might happen 0 0 0 0  Total GAD 7 Score 1 0 0 0  Anxiety Difficulty Not difficult at all - - -    BP Readings from Last 3 Encounters:  01/24/20 (!) 142/98  10/29/19 120/70  08/30/19 130/80    Physical Exam Vitals reviewed.  Constitutional:      General: She is not irritable.    Appearance: She is well-developed.  HENT:     Head: Normocephalic.     Right Ear: Tympanic membrane, ear canal and external ear normal. There is no impacted  cerumen.     Left Ear: Tympanic membrane, ear canal and external ear normal. There is no impacted cerumen.     Nose: Nose normal.     Mouth/Throat:     Mouth: Mucous membranes are moist.  Eyes:     General: Lids are everted, no foreign bodies appreciated. No scleral icterus.       Left eye: No foreign body or hordeolum.     Conjunctiva/sclera: Conjunctivae normal.     Right eye: Right conjunctiva is not injected.     Left eye: Left conjunctiva is not injected.     Pupils: Pupils are equal, round, and reactive to light.  Neck:     Thyroid: No thyromegaly.     Vascular: No JVD.     Trachea: No tracheal deviation.  Cardiovascular:     Rate and Rhythm: Normal rate and regular rhythm.     Heart sounds: Normal heart sounds. No murmur heard.  No friction rub. No gallop.   Pulmonary:     Effort: Pulmonary effort is normal. No respiratory distress.     Breath sounds: Normal breath sounds. No wheezing, rhonchi or rales.  Abdominal:     General: Bowel sounds are normal.     Palpations: Abdomen is soft. There is no  mass.     Tenderness: There is no abdominal tenderness. There is no guarding or rebound.  Musculoskeletal:        General: No tenderness. Normal range of motion.     Cervical back: Normal range of motion and neck supple.  Lymphadenopathy:     Cervical: No cervical adenopathy.  Skin:    General: Skin is warm.     Capillary Refill: Capillary refill takes less than 2 seconds.     Findings: No rash.  Neurological:     Mental Status: She is alert and oriented to person, place, and time.     Cranial Nerves: No cranial nerve deficit.     Deep Tendon Reflexes: Reflexes normal.  Psychiatric:        Mood and Affect: Mood is not anxious or depressed.     Wt Readings from Last 3 Encounters:  01/24/20 (!) 279 lb (126.6 kg)  10/29/19 276 lb (125.2 kg)  08/30/19 270 lb (122.5 kg)    BP (!) 142/98 (BP Location: Left Arm)   Pulse (!) 107   Ht 5\' 8"  (1.727 m)   Wt (!) 279 lb (126.6  kg)   SpO2 97%   BMI 42.42 kg/m   Assessment and Plan:  1. Anxiety Chronic.  Controlled.  Stable.  Gad score 1.  Continue sertraline 100 mg daily.  Will recheck in 6 months. - sertraline (ZOLOFT) 100 MG tablet; Take 1 tablet (100 mg total) by mouth daily.  Dispense: 90 tablet; Refill: 1  2. Reactive depression Chronic.  Controlled.  Stable.  PHQ score 1. - sertraline (ZOLOFT) 100 MG tablet; Take 1 tablet (100 mg total) by mouth daily.  Dispense: 90 tablet; Refill: 1  3. Hx of non anemic vitamin B12 deficiency Patient was determined to have a B12 deficiency by her GYN.  She was given the bile to do self injections but patient has been unable to do so.  Her sister who is a has been doing well but she is gone back to [employer] (consulting) and we will continue her IM injections here.  4. Elevated blood pressure reading without diagnosis of hypertension Patient was noted to have elevated blood pressure reading today which she has never had.  Patient was given information on blood pressure readings as well as a Dash diet.  Patient will return in 3 months for blood pressure check and a B12 injection.

## 2020-02-21 ENCOUNTER — Ambulatory Visit: Payer: BC Managed Care – PPO

## 2020-02-23 ENCOUNTER — Other Ambulatory Visit: Payer: Self-pay

## 2020-02-23 ENCOUNTER — Ambulatory Visit (INDEPENDENT_AMBULATORY_CARE_PROVIDER_SITE_OTHER): Payer: BC Managed Care – PPO

## 2020-02-23 DIAGNOSIS — Z8639 Personal history of other endocrine, nutritional and metabolic disease: Secondary | ICD-10-CM

## 2020-02-23 MED ORDER — CYANOCOBALAMIN 1000 MCG/ML IJ SOLN
1000.0000 ug | Freq: Once | INTRAMUSCULAR | Status: AC
  Administered 2020-02-23: 1000 ug via INTRAMUSCULAR

## 2020-03-01 ENCOUNTER — Other Ambulatory Visit: Payer: Self-pay

## 2020-03-03 ENCOUNTER — Encounter: Payer: Self-pay | Admitting: Family Medicine

## 2020-03-03 ENCOUNTER — Ambulatory Visit (INDEPENDENT_AMBULATORY_CARE_PROVIDER_SITE_OTHER): Payer: BC Managed Care – PPO | Admitting: Family Medicine

## 2020-03-03 ENCOUNTER — Other Ambulatory Visit: Payer: Self-pay

## 2020-03-03 VITALS — BP 120/70 | HR 91 | Temp 99.0°F | Ht 68.0 in | Wt 277.0 lb

## 2020-03-03 DIAGNOSIS — J029 Acute pharyngitis, unspecified: Secondary | ICD-10-CM

## 2020-03-03 DIAGNOSIS — J40 Bronchitis, not specified as acute or chronic: Secondary | ICD-10-CM | POA: Diagnosis not present

## 2020-03-03 MED ORDER — GUAIFENESIN-CODEINE 100-10 MG/5ML PO SYRP: 5.0000 mL | ORAL_SOLUTION | Freq: Four times a day (QID) | ORAL | 0 refills | Status: DC | PRN

## 2020-03-03 MED ORDER — AZITHROMYCIN 250 MG PO TABS: ORAL_TABLET | ORAL | 0 refills | Status: DC

## 2020-03-03 NOTE — Progress Notes (Addendum)
Date:  03/03/2020   Name:  Heidi Johnston   DOB:  Sep 27, 1985   MRN:  [id number]   Chief Complaint: Cough (started on Tuesday- getting worse, no fever, no loss of taste or smell)  Cough This is a new problem. The current episode started in the past 7 days. The problem has been gradually worsening. The cough is productive of sputum. Associated symptoms include chest pain, a fever, a sore throat, shortness of breath and wheezing. Pertinent negatives include no chills, ear congestion, ear pain, eye redness, headaches, hemoptysis, myalgias, nasal congestion, postnasal drip, rash, rhinorrhea, sweats or weight loss. Nothing aggravates the symptoms. She has tried nothing for the symptoms. There is no history of asthma or environmental allergies.    Lab Results  Component Value Date   CREATININE 0.94 07/14/2018   BUN 10 07/14/2018   NA 138 07/14/2018   K 4.2 07/14/2018   CL 101 07/14/2018   CO2 23 07/14/2018   Lab Results  Component Value Date   CHOL 194 08/11/2019   HDL 63 08/11/2019   LDLCALC 101 (H) 08/11/2019   TRIG 175 (H) 08/11/2019   CHOLHDL 3.7 07/14/2018   Lab Results  Component Value Date   TSH 2.380 08/11/2019   No results found for: HGBA1C No results found for: WBC, HGB, HCT, MCV, PLT No results found for: ALT, AST, GGT, ALKPHOS, BILITOT   Review of Systems  Constitutional: Positive for fever. Negative for chills, fatigue, unexpected weight change and weight loss.  HENT: Positive for sore throat. Negative for congestion, ear discharge, ear pain, postnasal drip, rhinorrhea, sinus pressure and sneezing.   Eyes: Negative for photophobia, pain, discharge, redness and itching.  Respiratory: Positive for cough, shortness of breath and wheezing. Negative for hemoptysis and stridor.   Cardiovascular: Positive for chest pain.  Gastrointestinal: Negative for abdominal pain, blood in stool, constipation, diarrhea, nausea and vomiting.  Endocrine: Negative for cold intolerance,  heat intolerance, polydipsia, polyphagia and polyuria.  Genitourinary: Negative for dysuria, flank pain, frequency, hematuria, menstrual problem, pelvic pain, urgency, vaginal bleeding and vaginal discharge.  Musculoskeletal: Negative for arthralgias, back pain and myalgias.  Skin: Negative for rash.  Allergic/Immunologic: Negative for environmental allergies and food allergies.  Neurological: Negative for dizziness, weakness, light-headedness, numbness and headaches.  Hematological: Negative for adenopathy. Does not bruise/bleed easily.  Psychiatric/Behavioral: Negative for dysphoric mood. The patient is not nervous/anxious.     Patient Active Problem List   Diagnosis Date Noted  . 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 Anxiety 08/25/2018    No Known Allergies  Past Surgical History:  Procedure Laterality Date  . LAPAROSCOPY     endometriosis  . TONSILLECTOMY  2015    Social History   Tobacco Use  . Smoking status: Never Smoker  . Smokeless tobacco: Never Used  Substance Use Topics  . Alcohol use: Never  . Drug use: Never     Medication list has been reviewed and updated.  Current Meds  Medication Sig  . cyanocobalamin (,VITAMIN B-12,) 1000 MCG/ML injection Inject into the muscle.  . Norethindrone-Ethinyl Estradiol-Fe Biphas (LO LOESTRIN FE) 1 MG-10 MCG / 10 MCG tablet Take 1 tablet by mouth daily.  . sertraline (ZOLOFT) 100 MG tablet Take 1 tablet (100 mg total) by mouth daily.    PHQ 2/9 Scores 01/24/2020 10/29/2019 08/30/2019 08/11/2019  PHQ - 2 Score 0 0 0 0  PHQ- 9 Score 1 0 1 1    GAD 7 : Generalized Anxiety Score 01/24/2020 10/29/2019 08/30/2019 08/11/2019  Nervous, Anxious, on Edge 1 0 0 0  Control/stop worrying 0 0 0 0  Worry too much - different things 0 0 0 0  Trouble relaxing 0 0 0 0  Restless 0 0 0 0  Easily annoyed or irritable 0 0 0 0  Afraid - awful might happen 0 0 0 0  Total GAD 7 Score 1 0 0 0  Anxiety Difficulty Not difficult at all - - -    BP Readings  from Last 3 Encounters:  03/03/20 120/70  01/24/20 (!) 142/98  10/29/19 120/70    Physical Exam Vitals and nursing note reviewed.  Constitutional:      Appearance: She is well-developed.  HENT:     Head: Normocephalic.     Right Ear: Tympanic membrane, ear canal and external ear normal.     Left Ear: Tympanic membrane, ear canal and external ear normal.     Nose: Nose normal.     Mouth/Throat:     Pharynx: Uvula midline. Posterior oropharyngeal erythema present.  Eyes:     General: Lids are everted, no foreign bodies appreciated. No scleral icterus.       Left eye: No foreign body or hordeolum.     Conjunctiva/sclera: Conjunctivae normal.     Right eye: Right conjunctiva is not injected.     Left eye: Left conjunctiva is not injected.     Pupils: Pupils are equal, round, and reactive to light.  Neck:     Thyroid: No thyromegaly.     Vascular: No JVD.     Trachea: No tracheal deviation.  Cardiovascular:     Rate and Rhythm: Normal rate and regular rhythm.     Heart sounds: Normal heart sounds. No murmur heard.  No friction rub. No gallop.   Pulmonary:     Effort: Pulmonary effort is normal. No respiratory distress.     Breath sounds: Normal breath sounds. No decreased breath sounds, wheezing, rhonchi or rales.  Abdominal:     General: Bowel sounds are normal.     Palpations: Abdomen is soft. There is no mass.     Tenderness: There is no abdominal tenderness. There is no guarding or rebound.  Musculoskeletal:        General: No tenderness. Normal range of motion.     Cervical back: Normal range of motion and neck supple.  Lymphadenopathy:     Cervical: No cervical adenopathy.  Skin:    General: Skin is warm.     Findings: No rash.  Neurological:     Mental Status: She is alert and oriented to person, place, and time.     Cranial Nerves: No cranial nerve deficit.     Deep Tendon Reflexes: Reflexes normal.  Psychiatric:        Mood and Affect: Mood is not anxious or  depressed.     Wt Readings from Last 3 Encounters:  03/03/20 277 lb (125.6 kg)  01/24/20 (!) 279 lb (126.6 kg)  10/29/19 276 lb (125.2 kg)    BP 120/70   Pulse 91   Temp 99 F (37.2 C) (Oral)   Ht 5\' 8"  (1.727 m)   Wt 277 lb (125.6 kg)   SpO2 96%   BMI 42.12 kg/m   Assessment and Plan: 1. Bronchitis Acute.  Persistent.  Stable.  Patient is fully vaccinated for Covid patient has a nonproductive cough since Tuesday.  There is been a low-grade fever at best with no other symptomatology.  Patient underwent a Covid  [facility] [place].  Covid test results were negative.  Exam was done and there was no rales rhonchi wheezes or rubs noted on auscultation of the lungs.  We will initiate a azithromycin 2 today followed by them 1 a day for 4 days.  Patient's been instructed to go to emergency room or urgent care facility over the weekend if symptoms should worsen.  Patient has been given Robitussin to take for symptomatic relief primarily at night for the cough- azithromycin (ZITHROMAX) 250 MG tablet; 2 tablets a day then 1 a day for 4 days  Dispense: 6 tablet; Refill: 0 - guaiFENesin-codeine (ROBITUSSIN AC) 100-10 MG/5ML syrup; Take 5 mLs by mouth 4 (four) times daily as needed for cough.  Dispense: 118 mL; Refill: 0  2. Pharyngitis, unspecified etiology New onset.  Persistent.  Stable.  As noted above we will start azithromycin for bronchial concerns however if there is any issue with pharyngitis of strep nature this will be covered as well. - azithromycin (ZITHROMAX) 250 MG tablet; 2 tablets a day then 1 a day for 4 days  Dispense: 6 tablet; Refill: 0 - guaiFENesin-codeine (ROBITUSSIN AC) 100-10 MG/5ML syrup; Take 5 mLs by mouth 4 (four) times daily as needed for cough.  Dispense: 118 mL; Refill: 0

## 2020-03-28 ENCOUNTER — Ambulatory Visit: Payer: BC Managed Care – PPO

## 2020-04-25 ENCOUNTER — Ambulatory Visit: Payer: BC Managed Care – PPO | Admitting: Family Medicine

## 2020-08-29 ENCOUNTER — Ambulatory Visit: Payer: BC Managed Care – PPO | Admitting: Family Medicine

## 2020-08-29 ENCOUNTER — Encounter: Payer: Self-pay | Admitting: Family Medicine

## 2020-08-29 ENCOUNTER — Other Ambulatory Visit: Payer: Self-pay

## 2020-08-29 ENCOUNTER — Other Ambulatory Visit: Payer: Self-pay | Admitting: Family Medicine

## 2020-08-29 VITALS — BP 118/86 | HR 68 | Ht 68.0 in | Wt 281.0 lb

## 2020-08-29 DIAGNOSIS — F419 Anxiety disorder, unspecified: Secondary | ICD-10-CM

## 2020-08-29 DIAGNOSIS — R5383 Other fatigue: Secondary | ICD-10-CM

## 2020-08-29 DIAGNOSIS — F329 Major depressive disorder, single episode, unspecified: Secondary | ICD-10-CM | POA: Diagnosis not present

## 2020-08-29 MED ORDER — SERTRALINE HCL 100 MG PO TABS: 100.0000 mg | ORAL_TABLET | Freq: Every day | ORAL | 1 refills | Status: DC

## 2020-08-29 NOTE — Progress Notes (Signed)
Date:  08/29/2020   Name:  Heidi Johnston   DOB:  09/25/1985   MRN:  [id number]   Chief Complaint: Depression (Stay same until gets into therapy better 10 and 0)  Depression        This is a chronic problem.  The current episode started more than 1 year ago.   The problem has been waxing and waning since onset.  Associated symptoms include fatigue, irritable, restlessness, decreased interest and sad.  Associated symptoms include no decreased concentration, no helplessness, no hopelessness, does not have insomnia, no appetite change, no body aches, no myalgias, no headaches and no indigestion.  Past treatments include SSRIs - Selective serotonin reuptake inhibitors.   Pertinent negatives include no thyroid problem. Thyroid Problem Presents for initial visit. Symptoms include depressed mood, diaphoresis, diarrhea, dry skin, fatigue, heat intolerance and nail problem. Patient reports no anxiety, cold intolerance, constipation, hair loss, menstrual problem, palpitations, tremors, visual change, weight gain or weight loss.    Lab Results  Component Value Date   CREATININE 0.94 07/14/2018   BUN 10 07/14/2018   NA 138 07/14/2018   K 4.2 07/14/2018   CL 101 07/14/2018   CO2 23 07/14/2018   Lab Results  Component Value Date   CHOL 194 08/11/2019   HDL 63 08/11/2019   LDLCALC 101 (H) 08/11/2019   TRIG 175 (H) 08/11/2019   CHOLHDL 3.7 07/14/2018   Lab Results  Component Value Date   TSH 2.380 08/11/2019   No results found for: HGBA1C No results found for: WBC, HGB, HCT, MCV, PLT No results found for: ALT, AST, GGT, ALKPHOS, BILITOT   Review of Systems  Constitutional: Positive for diaphoresis and fatigue. Negative for appetite change, chills, fever, unexpected weight change, weight gain and weight loss.  HENT: Negative for congestion, ear discharge, ear pain, rhinorrhea, sinus pressure, sneezing and sore throat.   Eyes: Negative for double vision, photophobia, pain, discharge,  redness and itching.  Respiratory: Negative for cough, shortness of breath, wheezing and stridor.   Cardiovascular: Negative for palpitations.  Gastrointestinal: Positive for diarrhea. Negative for abdominal pain, blood in stool, constipation, nausea and vomiting.  Endocrine: Positive for heat intolerance. Negative for cold intolerance, polydipsia, polyphagia and polyuria.  Genitourinary: Negative for dysuria, flank pain, frequency, hematuria, menstrual problem, pelvic pain, urgency, vaginal bleeding and vaginal discharge.  Musculoskeletal: Negative for arthralgias, back pain and myalgias.  Skin: Negative for rash.  Allergic/Immunologic: Negative for environmental allergies and food allergies.  Neurological: Negative for dizziness, tremors, weakness, light-headedness, numbness and headaches.  Hematological: Negative for adenopathy. Does not bruise/bleed easily.  Psychiatric/Behavioral: Positive for depression. Negative for decreased concentration and dysphoric mood. The patient is not nervous/anxious and does not have insomnia.     Patient Active Problem List   Diagnosis Date Noted  . 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 Anxiety 08/25/2018    No Known Allergies  Past Surgical History:  Procedure Laterality Date  . LAPAROSCOPY     endometriosis  . TONSILLECTOMY  2015    Social History   Tobacco Use  . Smoking status: Never Smoker  . Smokeless tobacco: Never Used  Substance Use Topics  . Alcohol use: Never  . Drug use: Never     Medication list has been reviewed and updated.  Current Meds  Medication Sig  . cyanocobalamin (,VITAMIN B-12,) 1000 MCG/ML injection Inject into the muscle.  . Norethindrone-Ethinyl Estradiol-Fe Biphas (LO LOESTRIN FE) 1 MG-10 MCG / 10 MCG tablet Take 1 tablet by mouth  daily.  . sertraline (ZOLOFT) 100 MG tablet Take 1 tablet (100 mg total) by mouth daily.    PHQ 2/9 Scores 08/29/2020 01/24/2020 10/29/2019 08/30/2019  PHQ - 2 Score 3 0 0 0  PHQ- 9 Score  10 1 0 1    GAD 7 : Generalized Anxiety Score 08/29/2020 01/24/2020 10/29/2019 08/30/2019  Nervous, Anxious, on Edge 0 1 0 0  Control/stop worrying 0 0 0 0  Worry too much - different things 0 0 0 0  Trouble relaxing 0 0 0 0  Restless 0 0 0 0  Easily annoyed or irritable 0 0 0 0  Afraid - awful might happen 0 0 0 0  Total GAD 7 Score 0 1 0 0  Anxiety Difficulty - Not difficult at all - -    BP Readings from Last 3 Encounters:  08/29/20 118/86  03/03/20 120/70  01/24/20 (!) 142/98    Physical Exam Vitals and nursing note reviewed.  Constitutional:      General: She is irritable.     Appearance: She is well-developed and well-nourished.  HENT:     Head: Normocephalic.     Right Ear: Tympanic membrane, ear canal and external ear normal. There is no impacted cerumen.     Left Ear: Tympanic membrane, ear canal and external ear normal. There is no impacted cerumen.     Nose: Nose normal. No congestion or rhinorrhea.     Mouth/Throat:     Mouth: Oropharynx is clear and moist. Mucous membranes are moist.  Eyes:     General: Lids are everted, no foreign bodies appreciated. No scleral icterus.       Left eye: No foreign body or hordeolum.     Extraocular Movements: EOM normal.     Conjunctiva/sclera: Conjunctivae normal.     Right eye: Right conjunctiva is not injected.     Left eye: Left conjunctiva is not injected.     Pupils: Pupils are equal, round, and reactive to light.  Neck:     Thyroid: No thyromegaly.     Vascular: No JVD.     Trachea: No tracheal deviation.  Cardiovascular:     Rate and Rhythm: Normal rate and regular rhythm.     Pulses: Intact distal pulses.     Heart sounds: Normal heart sounds. No murmur heard. No friction rub. No gallop.   Pulmonary:     Effort: Pulmonary effort is normal. No respiratory distress.     Breath sounds: Normal breath sounds. No wheezing, rhonchi or rales.  Abdominal:     General: Bowel sounds are normal.     Palpations: Abdomen is  soft. There is no hepatosplenomegaly or mass.     Tenderness: There is no abdominal tenderness. There is no guarding or rebound.  Musculoskeletal:        General: No tenderness or edema. Normal range of motion.     Cervical back: Normal range of motion and neck supple.  Lymphadenopathy:     Cervical: No cervical adenopathy.  Skin:    General: Skin is warm.     Findings: No rash.  Neurological:     Mental Status: She is alert and oriented to person, place, and time.     Cranial Nerves: No cranial nerve deficit.     Deep Tendon Reflexes: Strength normal. Reflexes normal.  Psychiatric:        Mood and Affect: Mood and affect normal. Mood is not anxious or depressed.     Wt Readings  from Last 3 Encounters:  08/29/20 281 lb (127.5 kg)  03/03/20 277 lb (125.6 kg)  01/24/20 (!) 279 lb (126.6 kg)    BP 118/86   Pulse 68   Ht 5\' 8"  (1.727 m)   Wt 281 lb (127.5 kg)   LMP 08/08/2020 (Approximate)   BMI 42.73 kg/m   Assessment and Plan: 1. Reactive depression Chronic.  Controlled.  Stable.  Even though gad score is 10 patient prefers to stay on current dosing of sertraline as she is in consultation with a therapist at this time.  We will recheck in 6 months. - sertraline (ZOLOFT) 100 MG tablet; Take 1 tablet (100 mg total) by mouth daily.  Dispense: 90 tablet; Refill: 1  2. Fatigue, unspecified type New onset of fatigue.  We will check a CBC and TSH given that she has a history of B12 deficiency. - CBC with Differential/Platelet - TSH  3. Anxiety Chronic.  Controlled.  Stable.  Gad score 0.  Continue sertraline 100 mg a day. - sertraline (ZOLOFT) 100 MG tablet; Take 1 tablet (100 mg total) by mouth daily.  Dispense: 90 tablet; Refill: 1

## 2020-08-30 LAB — CBC WITH DIFFERENTIAL/PLATELET
Basophils Absolute: 0.1 10*3/uL (ref 0.0–0.2)
Basos: 1 %
EOS (ABSOLUTE): 0.1 10*3/uL (ref 0.0–0.4)
Eos: 1 %
Hematocrit: 41.1 % (ref 34.0–46.6)
Hemoglobin: 13.8 g/dL (ref 11.1–15.9)
Immature Grans (Abs): 0 10*3/uL (ref 0.0–0.1)
Immature Granulocytes: 0 %
Lymphocytes Absolute: 3.4 10*3/uL — ABNORMAL HIGH (ref 0.7–3.1)
Lymphs: 35 %
MCH: 28.6 pg (ref 26.6–33.0)
MCHC: 33.6 g/dL (ref 31.5–35.7)
MCV: 85 fL (ref 79–97)
Monocytes Absolute: 0.6 10*3/uL (ref 0.1–0.9)
Monocytes: 6 %
Neutrophils Absolute: 5.6 10*3/uL (ref 1.4–7.0)
Neutrophils: 57 %
Platelets: 314 10*3/uL (ref 150–450)
RBC: 4.82 x10E6/uL (ref 3.77–5.28)
RDW: 13 % (ref 11.7–15.4)
WBC: 9.9 10*3/uL (ref 3.4–10.8)

## 2020-08-30 LAB — TSH: TSH: 2.95 u[IU]/mL (ref 0.450–4.500)

## 2020-09-13 ENCOUNTER — Ambulatory Visit: Payer: BC Managed Care – PPO | Admitting: Family Medicine

## 2020-09-13 ENCOUNTER — Other Ambulatory Visit: Payer: Self-pay

## 2020-09-13 ENCOUNTER — Encounter: Payer: Self-pay | Admitting: Family Medicine

## 2020-09-13 VITALS — BP 112/76 | HR 72 | Temp 98.6°F | Ht 68.0 in | Wt 280.0 lb

## 2020-09-13 DIAGNOSIS — J0100 Acute maxillary sinusitis, unspecified: Secondary | ICD-10-CM | POA: Diagnosis not present

## 2020-09-13 DIAGNOSIS — R059 Cough, unspecified: Secondary | ICD-10-CM | POA: Diagnosis not present

## 2020-09-13 MED ORDER — BENZONATATE 100 MG PO CAPS: 100.0000 mg | ORAL_CAPSULE | Freq: Two times a day (BID) | ORAL | 0 refills | Status: DC | PRN

## 2020-09-13 MED ORDER — MONTELUKAST SODIUM 10 MG PO TABS: 10.0000 mg | ORAL_TABLET | Freq: Every day | ORAL | 3 refills | Status: DC

## 2020-09-13 MED ORDER — AZITHROMYCIN 250 MG PO TABS: ORAL_TABLET | ORAL | 0 refills | Status: DC

## 2020-09-13 NOTE — Progress Notes (Signed)
Date:  09/13/2020   Name:  Heidi Johnston   DOB:  1986-06-16   MRN:  [id number]   Chief Complaint: Cough (Deep cough, "wheezing" started Saturday- worse yesterday. Very little production- no color to it)  Cough This is a new problem. The current episode started in the past 7 days. The problem has been gradually worsening. The cough is non-productive. Associated symptoms include headaches, heartburn, shortness of breath and wheezing. Pertinent negatives include no chest pain, chills, ear congestion, ear pain, eye redness, fever, hemoptysis, myalgias, nasal congestion, postnasal drip, rash, rhinorrhea, sore throat or sweats. The symptoms are aggravated by pollens (tre /high). The treatment provided moderate relief. There is no history of environmental allergies.    Lab Results  Component Value Date   CREATININE 0.94 07/14/2018   BUN 10 07/14/2018   NA 138 07/14/2018   K 4.2 07/14/2018   CL 101 07/14/2018   CO2 23 07/14/2018   Lab Results  Component Value Date   CHOL 194 08/11/2019   HDL 63 08/11/2019   LDLCALC 101 (H) 08/11/2019   TRIG 175 (H) 08/11/2019   CHOLHDL 3.7 07/14/2018   Lab Results  Component Value Date   TSH 2.950 08/29/2020   No results found for: HGBA1C Lab Results  Component Value Date   WBC 9.9 08/29/2020   HGB 13.8 08/29/2020   HCT 41.1 08/29/2020   MCV 85 08/29/2020   PLT 314 08/29/2020   No results found for: ALT, AST, GGT, ALKPHOS, BILITOT   Review of Systems  Constitutional: Negative.  Negative for chills, fatigue, fever and unexpected weight change.  HENT: Negative for congestion, ear discharge, ear pain, postnasal drip, rhinorrhea, sinus pressure, sneezing and sore throat.   Eyes: Negative for photophobia, pain, discharge, redness and itching.  Respiratory: Positive for cough, shortness of breath and wheezing. Negative for hemoptysis and stridor.   Cardiovascular: Negative for chest pain.  Gastrointestinal: Positive for heartburn. Negative  for abdominal pain, blood in stool, constipation, diarrhea, nausea and vomiting.  Endocrine: Negative for cold intolerance, heat intolerance, polydipsia, polyphagia and polyuria.  Genitourinary: Negative for dysuria, flank pain, frequency, hematuria, menstrual problem, pelvic pain, urgency, vaginal bleeding and vaginal discharge.  Musculoskeletal: Negative for arthralgias, back pain and myalgias.  Skin: Negative for rash.  Allergic/Immunologic: Negative for environmental allergies and food allergies.  Neurological: Positive for headaches. Negative for dizziness, weakness, light-headedness and numbness.  Hematological: Negative for adenopathy. Does not bruise/bleed easily.  Psychiatric/Behavioral: Negative for dysphoric mood. The patient is not nervous/anxious.     Patient Active Problem List   Diagnosis Date Noted  . 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 Anxiety 08/25/2018    No Known Allergies  Past Surgical History:  Procedure Laterality Date  . LAPAROSCOPY     endometriosis  . TONSILLECTOMY  2015    Social History   Tobacco Use  . Smoking status: Never Smoker  . Smokeless tobacco: Never Used  Substance Use Topics  . Alcohol use: Never  . Drug use: Never     Medication list has been reviewed and updated.  Current Meds  Medication Sig  . cyanocobalamin (,VITAMIN B-12,) 1000 MCG/ML injection Inject into the muscle.  . Norethindrone-Ethinyl Estradiol-Fe Biphas (LO LOESTRIN FE) 1 MG-10 MCG / 10 MCG tablet Take 1 tablet by mouth daily.  . sertraline (ZOLOFT) 100 MG tablet Take 1 tablet (100 mg total) by mouth daily.    PHQ 2/9 Scores 09/13/2020 08/29/2020 01/24/2020 10/29/2019  PHQ - 2 Score 0 3 0 0  PHQ- 9 Score 0 10 1 0    GAD 7 : Generalized Anxiety Score 09/13/2020 08/29/2020 01/24/2020 10/29/2019  Nervous, Anxious, on Edge 0 0 1 0  Control/stop worrying 0 0 0 0  Worry too much - different things 0 0 0 0  Trouble relaxing 0 0 0 0  Restless 0 0 0 0  Easily annoyed or irritable  0 0 0 0  Afraid - awful might happen 0 0 0 0  Total GAD 7 Score 0 0 1 0  Anxiety Difficulty - - Not difficult at all -    BP Readings from Last 3 Encounters:  09/13/20 112/76  08/29/20 118/86  03/03/20 120/70    Physical Exam Vitals and nursing note reviewed.  Constitutional:      Appearance: She is well-developed.  HENT:     Head: Normocephalic.     Right Ear: Tympanic membrane, ear canal and external ear normal.     Left Ear: Tympanic membrane, ear canal and external ear normal.     Nose: Congestion present.     Right Sinus: No maxillary sinus tenderness or frontal sinus tenderness.     Left Sinus: Maxillary sinus tenderness present. No frontal sinus tenderness.     Mouth/Throat:     Mouth: Mucous membranes are moist.  Eyes:     General: Lids are everted, no foreign bodies appreciated. No scleral icterus.       Left eye: No foreign body or hordeolum.     Conjunctiva/sclera: Conjunctivae normal.     Right eye: Right conjunctiva is not injected.     Left eye: Left conjunctiva is not injected.     Pupils: Pupils are equal, round, and reactive to light.  Neck:     Thyroid: No thyromegaly.     Vascular: No JVD.     Trachea: No tracheal deviation.  Cardiovascular:     Rate and Rhythm: Normal rate and regular rhythm.     Heart sounds: Normal heart sounds. No murmur heard. No friction rub. No gallop.   Pulmonary:     Effort: Pulmonary effort is normal. No respiratory distress.     Breath sounds: Normal breath sounds. No decreased breath sounds, wheezing, rhonchi or rales.     Comments: Increased E/I Abdominal:     General: Bowel sounds are normal.     Palpations: Abdomen is soft. There is no mass.     Tenderness: There is no abdominal tenderness. There is no guarding or rebound.  Musculoskeletal:        General: No tenderness. Normal range of motion.     Cervical back: Normal range of motion and neck supple.  Lymphadenopathy:     Cervical: No cervical adenopathy.   Skin:    General: Skin is warm.     Findings: No rash.  Neurological:     Mental Status: She is alert and oriented to person, place, and time.     Cranial Nerves: No cranial nerve deficit.     Deep Tendon Reflexes: Reflexes normal.  Psychiatric:        Mood and Affect: Mood is not anxious or depressed.     Wt Readings from Last 3 Encounters:  09/13/20 280 lb (127 kg)  08/29/20 281 lb (127.5 kg)  03/03/20 277 lb (125.6 kg)    BP 112/76   Pulse 72   Temp 98.6 F (37 C) (Oral)   Ht 5\' 8"  (1.727 m)   Wt 280 lb (127 kg)   LMP  08/23/2020 (Approximate)   SpO2 98%   BMI 42.57 kg/m   Assessment and Plan: 1. Acute maxillary sinusitis, recurrence not specified New onset.  Persistent.  Stable.  History and exam is consistent with maxillary sinusitis particular tenderness over the left.  We will initiate azithromycin 150 mg 2 today followed by 1 a day for 4 days and Singulair 10 mg once a day. - azithromycin (ZITHROMAX) 250 MG tablet; 2 today then 1 a day for 4 days  Dispense: 6 tablet; Refill: 0 - montelukast (SINGULAIR) 10 MG tablet; Take 1 tablet (10 mg total) by mouth at bedtime.  Dispense: 30 tablet; Refill: 3  2. Cough New onset.  Persistent.  Stable.  There is a slight increase of expiratory to inspiratory phase.  We will prescribe Tessalon Perles 1 capsule twice a day as needed for cough along with the initiation of Mucinex DM. - benzonatate (TESSALON) 100 MG capsule; Take 1 capsule (100 mg total) by mouth 2 (two) times daily as needed for cough.  Dispense: 20 capsule; Refill: 0

## 2020-10-04 ENCOUNTER — Encounter: Payer: Self-pay | Admitting: Family Medicine

## 2020-10-04 ENCOUNTER — Ambulatory Visit
Admission: RE | Admit: 2020-10-04 | Discharge: 2020-10-04 | Disposition: A | Discharge status: Home / Self Care | Payer: BC Managed Care – PPO | Source: Ambulatory Visit | Attending: Family Medicine | Admitting: Family Medicine

## 2020-10-04 ENCOUNTER — Other Ambulatory Visit: Payer: Self-pay

## 2020-10-04 ENCOUNTER — Ambulatory Visit
Admission: RE | Admit: 2020-10-04 | Discharge: 2020-10-04 | Disposition: A | Discharge status: Home / Self Care | Payer: BC Managed Care – PPO | Attending: Family Medicine | Admitting: Family Medicine

## 2020-10-04 ENCOUNTER — Ambulatory Visit: Payer: BC Managed Care – PPO | Admitting: Family Medicine

## 2020-10-04 VITALS — BP 122/82 | HR 88 | Ht 68.0 in | Wt 285.0 lb

## 2020-10-04 DIAGNOSIS — R053 Chronic cough: Secondary | ICD-10-CM

## 2020-10-04 DIAGNOSIS — U099 Post covid-19 condition, unspecified: Secondary | ICD-10-CM

## 2020-10-04 DIAGNOSIS — J4531 Mild persistent asthma with (acute) exacerbation: Secondary | ICD-10-CM | POA: Diagnosis not present

## 2020-10-04 DIAGNOSIS — J40 Bronchitis, not specified as acute or chronic: Secondary | ICD-10-CM

## 2020-10-04 MED ORDER — ALBUTEROL SULFATE HFA 108 (90 BASE) MCG/ACT IN AERS: 2.0000 | INHALATION_SPRAY | Freq: Four times a day (QID) | RESPIRATORY_TRACT | 2 refills | Status: DC | PRN

## 2020-10-04 MED ORDER — PREDNISONE 10 MG PO TABS: 10.0000 mg | ORAL_TABLET | Freq: Every day | ORAL | 0 refills | Status: DC

## 2020-10-04 NOTE — Progress Notes (Signed)
Date:  10/04/2020   Name:  Heidi Johnston   DOB:  1985-10-01   MRN:  [id number]   Chief Complaint: Cough (Had ZPack 3 weeks ago- no color from production. Cough- same at night. )  Cough This is a recurrent problem. The current episode started 1 to 4 weeks ago. The problem has been waxing and waning. The problem occurs hourly. The cough is non-productive. Associated symptoms include chest pain, shortness of breath and wheezing. Pertinent negatives include no chills, ear congestion, ear pain, fever, headaches, hemoptysis, myalgias, nasal congestion, postnasal drip, rash, rhinorrhea or sore throat. The symptoms are aggravated by pollens. She has tried leukotriene antagonists (azithromycin) for the symptoms. The treatment provided no relief. There is no history of environmental allergies.    Lab Results  Component Value Date   CREATININE 0.94 07/14/2018   BUN 10 07/14/2018   NA 138 07/14/2018   K 4.2 07/14/2018   CL 101 07/14/2018   CO2 23 07/14/2018   Lab Results  Component Value Date   CHOL 194 08/11/2019   HDL 63 08/11/2019   LDLCALC 101 (H) 08/11/2019   TRIG 175 (H) 08/11/2019   CHOLHDL 3.7 07/14/2018   Lab Results  Component Value Date   TSH 2.950 08/29/2020   No results found for: HGBA1C Lab Results  Component Value Date   WBC 9.9 08/29/2020   HGB 13.8 08/29/2020   HCT 41.1 08/29/2020   MCV 85 08/29/2020   PLT 314 08/29/2020   No results found for: ALT, AST, GGT, ALKPHOS, BILITOT   Review of Systems  Constitutional: Negative for chills and fever.  HENT: Negative for drooling, ear discharge, ear pain, postnasal drip, rhinorrhea and sore throat.   Respiratory: Positive for cough, shortness of breath and wheezing. Negative for hemoptysis.   Cardiovascular: Positive for chest pain. Negative for palpitations and leg swelling.  Gastrointestinal: Negative for abdominal pain, blood in stool, constipation, diarrhea and nausea.  Endocrine: Negative for polydipsia.   Genitourinary: Negative for dysuria, frequency, hematuria and urgency.  Musculoskeletal: Negative for back pain, myalgias and neck pain.  Skin: Negative for rash.  Allergic/Immunologic: Negative for environmental allergies.  Neurological: Negative for dizziness and headaches.  Hematological: Does not bruise/bleed easily.  Psychiatric/Behavioral: Negative for suicidal ideas. The patient is not nervous/anxious.     Patient Active Problem List   Diagnosis Date Noted  . 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 Anxiety 08/25/2018    No Known Allergies  Past Surgical History:  Procedure Laterality Date  . LAPAROSCOPY     endometriosis  . TONSILLECTOMY  2015    Social History   Tobacco Use  . Smoking status: Never Smoker  . Smokeless tobacco: Never Used  Substance Use Topics  . Alcohol use: Never  . Drug use: Never     Medication list has been reviewed and updated.  Current Meds  Medication Sig  . cyanocobalamin (,VITAMIN B-12,) 1000 MCG/ML injection Inject into the muscle.  . montelukast (SINGULAIR) 10 MG tablet Take 1 tablet (10 mg total) by mouth at bedtime.  . Norethindrone-Ethinyl Estradiol-Fe Biphas (LO LOESTRIN FE) 1 MG-10 MCG / 10 MCG tablet Take 1 tablet by mouth daily.  . sertraline (ZOLOFT) 100 MG tablet Take 1 tablet (100 mg total) by mouth daily.  . [DISCONTINUED] azithromycin (ZITHROMAX) 250 MG tablet 2 today then 1 a day for 4 days    PHQ 2/9 Scores 09/13/2020 08/29/2020 01/24/2020 10/29/2019  PHQ - 2 Score 0 3 0 0  PHQ- 9 Score  0 10 1 0    GAD 7 : Generalized Anxiety Score 09/13/2020 08/29/2020 01/24/2020 10/29/2019  Nervous, Anxious, on Edge 0 0 1 0  Control/stop worrying 0 0 0 0  Worry too much - different things 0 0 0 0  Trouble relaxing 0 0 0 0  Restless 0 0 0 0  Easily annoyed or irritable 0 0 0 0  Afraid - awful might happen 0 0 0 0  Total GAD 7 Score 0 0 1 0  Anxiety Difficulty - - Not difficult at all -    BP Readings from Last 3 Encounters:  10/04/20  122/82  09/13/20 112/76  08/29/20 118/86    Physical Exam  Wt Readings from Last 3 Encounters:  10/04/20 285 lb (129.3 kg)  09/13/20 280 lb (127 kg)  08/29/20 281 lb (127.5 kg)    BP 122/82   Pulse 88   Ht 5\' 8"  (1.727 m)   Wt 285 lb (129.3 kg)   LMP 10/03/2020 (Exact Date)   BMI 43.33 kg/m   Assessment and Plan: 1. Chronic cough Chronic.  Patient has had a cough that is been rather persistent on a daily basis at least on an hourly timeframe over the past 6 weeks.  Patient is having abbreviated inspiration which would suggest that there is some air trapping and we will initiate albuterol inhaler 2 puffs every 6 hours. - albuterol (VENTOLIN HFA) 108 (90 Base) MCG/ACT inhaler; Inhale 2 puffs into the lungs every 6 (six) hours as needed for wheezing or shortness of breath.  Dispense: 8 g; Refill: 2 - DG Chest 2 View; Future  2. Bronchitis Chronic.  Persistent.  There is no indication for antibiotic and we will use bronchodilation at this time as well as obtaining a chest x-ray to see if there is any post-COVID changes or community-acquired pneumonia.  Patient has recently completed a course of azithromycin and if continued symptoms her next Pap is initiation of a second antibiotic - albuterol (VENTOLIN HFA) 108 (90 Base) MCG/ACT inhaler; Inhale 2 puffs into the lungs every 6 (six) hours as needed for wheezing or shortness of breath.  Dispense: 8 g; Refill: 2 - DG Chest 2 View; Future  3. Mild persistent reactive airway disease with acute exacerbation New onset.  Episodic.  Stable.  Patient does not have a history of asthma but this is very suggestive of air trapping and we will initiate bronchodilation with albuterol inhaler as noted - albuterol (VENTOLIN HFA) 108 (90 Base) MCG/ACT inhaler; Inhale 2 puffs into the lungs every 6 (six) hours as needed for wheezing or shortness of breath.  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  There was no immediate cough or anything afterwards however patient has continued to have a cough over the last 5 weeks which I rather doubt is connected but we will obtain a chest x-ray to see if there is any residual suggestion of long-term Covid. - albuterol (VENTOLIN HFA) 108 (90 Base) MCG/ACT inhaler; Inhale 2 puffs into the lungs every 6 (six) hours as needed for wheezing or shortness of breath.  Dispense: 8 g; Refill: 2 - DG Chest 2 View; Future

## 2020-10-04 NOTE — Patient Instructions (Signed)
American Journal for Respiratory Critical Care Medicine, 190, P5-P6. Retrieved from https://www.thoracic.org/patients/patient-resources/resources/metered-dose-inhaler-mdi.pdf">  How to Use a Metered Dose Inhaler  A metered dose inhaler (MDI) is a handheld device filled with medicine that must be breathed into the lungs (inhaled). The medicine is delivered by pushing down on a metal canister. This releases a preset amount of spray and mist through the mouth and into the lungs. Each MDI canister holds a certain number of doses (puffs). Using a spacer with a metered dose inhaler may be recommended to help get more medicine into the lungs. A spacer is a plastic tube that connects to the MDI on one end and has a mouthpiece on the other end. A spacer holds the medicine in the tube for a short time. This allows more medicine to be inhaled. The MDI can be used to deliver many kinds of inhaled medicines, including:  Quick relief or rescue medicines, such as bronchodilators.  Controller medicines, such as corticosteroids. What are the risks?  If you do not use your inhaler correctly, medicine might not reach your lungs to help you breathe.  If you do not have enough strength to push down the canister to make it spray, ask your health care provider for ways to help.  The medicine in the MDI may cause side effects, such as: ? Mouth sores (thrush). ? Cough. ? Hoarseness. ? Shakiness. ? Headache. Supplies needed:  A metered dose inhaler.  A spacer, if recommended. How to use a metered dose inhaler without a spacer 1. Remove the cap from the inhaler. 2. If you are using the inhaler for the first time, shake it for 5 seconds, turn it away from your face, then release 4 puffs into the air. This is called priming. 3. Shake the inhaler for 5 seconds. 4. Position the inhaler so the top of the canister faces up. 5. Put your index finger on the top of the medicine canister. Support the bottom of the  inhaler with your thumb. 6. Breathe out normally and as completely as possible, away from the inhaler. 7. Either place the inhaler between your teeth and close your lips tightly around the mouthpiece, or hold the inhaler 1-2 inches (2.5-5 cm) away from your open mouth. Keep your tongue down out of the way. If you are unsure which technique to use, ask your health care provider. 8. Press the canister down with your index finger to release the medicine. Inhale deeply and slowly through your mouth until your lungs are completely filled. Do not breathe in through your nose. Inhaling should take 4-6 seconds. 9. Hold the medicine in your lungs for 5-10 seconds (10 seconds is best). This helps the medicine get into the small airways of your lungs. 10. Remove the inhaler from your mouth, turn your head, and breathe out normally. 11. Wait about 1 minute between puffs or as directed. Then repeat steps 3-10 until you have taken the number of puffs that your health care provider directed. 12. Put the cap on the inhaler. 13. If you are using a steroid inhaler, rinse your mouth with water, gargle, and spit out the water. Do not swallow the water.   How to use a metered dose inhaler with a spacer 1. Remove the cap from the inhaler. 2. If you are using the inhaler for the first time, shake it for 5 seconds, turn it away from your face, then release 4 puffs into the air. This is called priming. 3. Shake the inhaler for 5   seconds. 4. Place the open end of the spacer onto the inhaler mouthpiece. 5. Position the inhaler so the top of the canister faces up and the spacer mouthpiece faces you. 6. Put your index finger on the top of the medicine canister. Support the bottom of the inhaler and the spacer with your thumb. 7. Breathe out normally and as completely as possible, away from the spacer. 8. Place the spacer between your teeth and close your lips tightly around it. Keep your tongue down out of the way. 9. Press  the canister down with your index finger to release the medicine, then inhale deeply and slowly through your mouth until your lungs are completely filled. Do not breathe in through your nose. Inhaling should take 4-6 seconds. 10. Hold the medicine in your lungs for 5-10 seconds (10 seconds is best). This helps the medicine get into the small airways of your lungs. 11. Remove the spacer from your mouth, turn your head, and breathe out normally. 12. Wait about 1 minute between puffs or as directed. Then repeat steps 3-11 until you have taken the number of puffs that your health care provider directed. 13. Remove the spacer from the inhaler and put the cap on the inhaler. 14. If you are using a steroid inhaler, rinse your mouth with water, gargle, and spit out the water. Do not swallow the water.   Follow these instructions at home: Caring for your MDI  Store your inhaler at or near room temperature. A cold MDI will not work properly.  Follow directions on the package insert for care and cleaning of your MDI and spacer. General instructions  Take your inhaled medicine only as told by your health care provider. Do not use the inhaler more than directed by your health care provider.  Refill your MDI with medicine before all the preset doses have been used. ? If your inhaler has a counter, check it to determine how full your MDI is. The number you see tells you how many doses are left. ? If your inhaler does not have a counter, ask your health care provider when you will need to refill it. Then write the refill date on a calendar or on your MDI canister. ? Keep in mind that you cannot tell when the medicine in an inhaler is empty by shaking it. You may feel or hear something in the canister even when the preset medicine doses have been used up. Keeping track of your dosages is important.  Do not use any products that contain nicotine or tobacco, such as cigarettes, e-cigarettes, and chewing tobacco.  If you need help quitting, ask your health care provider.  Keep all follow-up visits as told by your health care provider. This is important. Where to find more information  Centers for Disease Control and Prevention: www.cdc.gov  American Lung Association: www.lung.org Contact a health care provider if:  Symptoms are only partially relieved with your inhaler.  You are having trouble using your inhaler.  You have side effects from the medicine.  You have chills or a fever.  You have night sweats.  There is blood in your thick saliva (phlegm). Get help right away if:  You have dizziness.  You have a fast heart rate.  You have severe shortness of breath.  You have difficulty breathing. These symptoms may represent a serious problem that is an emergency. Do not wait to see if the symptoms will go away. Get medical help right away. Call your local emergency   services (911 in the [place] Do not drive yourself to the hospital. Summary  A metered dose inhaler is a handheld device for taking medicine that must be breathed into the lungs (inhaled).  Take your inhaled medicine only as told by your health care provider. Do not use the inhaler more than directed by your health care provider.  You cannot tell when the medicine is gone in an inhaler by shaking it. Refill it with medicine before all the preset doses have been used.  Follow directions on the package insert for care and cleaning of your MDI and spacer. This information is not intended to replace advice given to you by your health care provider. Make sure you discuss any questions you have with your health care provider. Document Revised: 07/27/2019 Document Reviewed: 07/27/2019 Elsevier Patient Education  2021 Elsevier Inc.  

## 2021-03-01 ENCOUNTER — Ambulatory Visit: Payer: BC Managed Care – PPO | Admitting: Family Medicine

## 2021-04-06 ENCOUNTER — Ambulatory Visit: Payer: BC Managed Care – PPO | Admitting: Family Medicine

## 2021-04-12 ENCOUNTER — Other Ambulatory Visit: Payer: Self-pay

## 2021-04-12 ENCOUNTER — Ambulatory Visit: Payer: BLUE CROSS/BLUE SHIELD | Admitting: Family Medicine

## 2021-04-12 ENCOUNTER — Telehealth: Payer: Self-pay

## 2021-04-12 DIAGNOSIS — F419 Anxiety disorder, unspecified: Secondary | ICD-10-CM

## 2021-04-12 DIAGNOSIS — F329 Major depressive disorder, single episode, unspecified: Secondary | ICD-10-CM

## 2021-04-12 MED ORDER — SERTRALINE HCL 100 MG PO TABS: 100.0000 mg | ORAL_TABLET | Freq: Every day | ORAL | 0 refills | Status: DC

## 2021-04-12 NOTE — Telephone Encounter (Signed)
Patient had ask about change in pharmacy to [facility] in [place]

## 2021-04-16 ENCOUNTER — Other Ambulatory Visit: Payer: Self-pay | Admitting: Family Medicine

## 2021-04-17 NOTE — Telephone Encounter (Signed)
Historical Provider  Requested Prescriptions  Pending Prescriptions Disp Refills  . LO LOESTRIN FE 1 MG-10 MCG / 10 MCG tablet [Pharmacy Med Name: LO LOESTRIN FE TABLETS 28S] 84 tablet     Sig: TAKE 1 TABLET BY MOUTH EVERY DAY     OB/GYN:  Contraceptives Passed - 04/16/2021  6:05 PM      Passed - Last BP in normal range    BP Readings from Last 1 Encounters:  10/04/20 122/82         Passed - Valid encounter within last 12 months    Recent Outpatient Visits          6 months ago Chronic cough   [facility] Duanne Limerick, MD   7 months ago Acute maxillary sinusitis, recurrence not specified   [facility] Duanne Limerick, MD   7 months ago Reactive depression   [facility] Duanne Limerick, MD   1 year ago Bronchitis   [facility] Duanne Limerick, MD   1 year ago Anxiety   [facility] Duanne Limerick, MD

## 2021-06-01 ENCOUNTER — Ambulatory Visit: Payer: No Typology Code available for payment source | Admitting: Family Medicine

## 2021-06-01 ENCOUNTER — Encounter: Payer: Self-pay | Admitting: Family Medicine

## 2021-06-01 ENCOUNTER — Other Ambulatory Visit: Payer: Self-pay

## 2021-06-01 VITALS — BP 136/72 | HR 80 | Ht 68.0 in | Wt 294.0 lb

## 2021-06-01 DIAGNOSIS — E538 Deficiency of other specified B group vitamins: Secondary | ICD-10-CM

## 2021-06-01 DIAGNOSIS — F329 Major depressive disorder, single episode, unspecified: Secondary | ICD-10-CM | POA: Diagnosis not present

## 2021-06-01 DIAGNOSIS — F419 Anxiety disorder, unspecified: Secondary | ICD-10-CM

## 2021-06-01 MED ORDER — CYANOCOBALAMIN 1000 MCG/ML IJ SOLN
1000.0000 ug | Freq: Once | INTRAMUSCULAR | Status: AC
Start: 2021-06-01 — End: 2021-06-01
  Administered 2021-06-01: 1000 ug via INTRAMUSCULAR

## 2021-06-01 NOTE — Addendum Note (Signed)
Addended by: Everitt Amber on: 06/01/2021 02:51 PM   Modules accepted: Orders

## 2021-06-01 NOTE — Progress Notes (Signed)
Date:  06/01/2021   Name:  Heidi Johnston   DOB:  1985-07-18   MRN:  [id number]   Chief Complaint: Depression (46 and 4)  Depression        This is a chronic problem.  The current episode started more than 1 year ago.   The problem occurs intermittently.  The problem has been waxing and waning since onset.  Associated symptoms include decreased concentration, fatigue, helplessness, insomnia, irritable, decreased interest and sad.  Associated symptoms include no myalgias, no headaches and no suicidal ideas.( agoraphobia)     The symptoms are aggravated by work stress and family issues.  Past treatments include SSRIs - Selective serotonin reuptake inhibitors.  Compliance with treatment is good.  Previous treatment provided mild relief.  Lab Results  Component Value Date   NA 138 07/14/2018   K 4.2 07/14/2018   CO2 23 07/14/2018   GLUCOSE 94 07/14/2018   BUN 10 07/14/2018   CREATININE 0.94 07/14/2018   CALCIUM 9.4 07/14/2018   GFRNONAA 81 07/14/2018   Lab Results  Component Value Date   CHOL 194 08/11/2019   HDL 63 08/11/2019   LDLCALC 101 (H) 08/11/2019   TRIG 175 (H) 08/11/2019   CHOLHDL 3.7 07/14/2018   Lab Results  Component Value Date   TSH 2.950 08/29/2020   No results found for: HGBA1C Lab Results  Component Value Date   WBC 9.9 08/29/2020   HGB 13.8 08/29/2020   HCT 41.1 08/29/2020   MCV 85 08/29/2020   PLT 314 08/29/2020   No results found for: ALT, AST, GGT, ALKPHOS, BILITOT No results found for: 25OHVITD2, 25OHVITD3, VD25OH   Review of Systems  Constitutional:  Positive for fatigue. Negative for chills and fever.  HENT:  Negative for drooling, ear discharge, ear pain and sore throat.   Respiratory:  Negative for cough, shortness of breath and wheezing.   Cardiovascular:  Negative for chest pain, palpitations and leg swelling.  Gastrointestinal:  Negative for abdominal pain, blood in stool, constipation, diarrhea and nausea.  Endocrine: Negative for  polydipsia.  Genitourinary:  Negative for dysuria, frequency, hematuria and urgency.  Musculoskeletal:  Negative for back pain, myalgias and neck pain.  Skin:  Negative for rash.  Allergic/Immunologic: Negative for environmental allergies.  Neurological:  Negative for dizziness and headaches.  Hematological:  Does not bruise/bleed easily.  Psychiatric/Behavioral:  Positive for decreased concentration and depression. Negative for suicidal ideas. The patient has insomnia. The patient is not nervous/anxious.    Patient Active Problem List   Diagnosis Date Noted   Reactive depression 08/25/2018   Anxiety 08/25/2018    No Known Allergies  Past Surgical History:  Procedure Laterality Date   LAPAROSCOPY     endometriosis   TONSILLECTOMY  2015    Social History   Tobacco Use   Smoking status: Never   Smokeless tobacco: Never  Substance Use Topics   Alcohol use: Never   Drug use: Never     Medication list has been reviewed and updated.  Current Meds  Medication Sig   cyanocobalamin (,VITAMIN B-12,) 1000 MCG/ML injection Inject into the muscle.   ergocalciferol (VITAMIN D2) 1.25 MG (50000 UT) capsule Take 1 capsule by mouth once a week. GYN   Norethindrone-Ethinyl Estradiol-Fe Biphas (LO LOESTRIN FE) 1 MG-10 MCG / 10 MCG tablet Take 1 tablet by mouth daily.   sertraline (ZOLOFT) 100 MG tablet Take 1 tablet (100 mg total) by mouth daily.    PHQ 2/9 Scores 06/01/2021 09/13/2020  08/29/2020 01/24/2020  PHQ - 2 Score 5 0 3 0  PHQ- 9 Score 17 0 10 1    GAD 7 : Generalized Anxiety Score 06/01/2021 09/13/2020 08/29/2020 01/24/2020  Nervous, Anxious, on Edge 1 0 0 1  Control/stop worrying 1 0 0 0  Worry too much - different things 1 0 0 0  Trouble relaxing 0 0 0 0  Restless 0 0 0 0  Easily annoyed or irritable 0 0 0 0  Afraid - awful might happen 1 0 0 0  Total GAD 7 Score 4 0 0 1  Anxiety Difficulty Not difficult at all - - Not difficult at all    BP Readings from Last 3 Encounters:   06/01/21 136/72  10/04/20 122/82  09/13/20 112/76    Physical Exam Vitals and nursing note reviewed.  Constitutional:      General: She is irritable. She is not in acute distress.    Appearance: She is not diaphoretic.  HENT:     Head: Normocephalic and atraumatic.     Right Ear: Tympanic membrane and external ear normal. There is no impacted cerumen.     Left Ear: Tympanic membrane and external ear normal. There is no impacted cerumen.     Nose: Nose normal. No congestion or rhinorrhea.  Eyes:     General:        Right eye: No discharge.        Left eye: No discharge.     Conjunctiva/sclera: Conjunctivae normal.     Pupils: Pupils are equal, round, and reactive to light.  Neck:     Thyroid: No thyromegaly.     Vascular: No JVD.  Cardiovascular:     Rate and Rhythm: Normal rate and regular rhythm.     Heart sounds: Normal heart sounds. No murmur heard.   No friction rub. No gallop.  Pulmonary:     Effort: Pulmonary effort is normal.     Breath sounds: Normal breath sounds. No wheezing or rhonchi.  Abdominal:     General: Bowel sounds are normal.     Palpations: Abdomen is soft. There is no mass.     Tenderness: There is no abdominal tenderness. There is no guarding.  Musculoskeletal:        General: Normal range of motion.     Cervical back: Normal range of motion and neck supple.  Lymphadenopathy:     Cervical: No cervical adenopathy.  Skin:    General: Skin is warm and dry.  Neurological:     Mental Status: She is alert.     Deep Tendon Reflexes: Reflexes are normal and symmetric.    Wt Readings from Last 3 Encounters:  06/01/21 294 lb (133.4 kg)  10/04/20 285 lb (129.3 kg)  09/13/20 280 lb (127 kg)    BP 136/72   Pulse 80   Ht 5\' 8"  (1.727 m)   Wt 294 lb (133.4 kg)   LMP 05/17/2021 (Approximate)   BMI 44.70 kg/m   Assessment and Plan:  1. Reactive depression Chronic.  Uncontrolled.  Relatively stable.  Patient is nonsuicidal.  PHQ is 60 and patient  would like an adjustment or change in her antidepressant therapy and I would agree.  Rather than starting tapering we will refer to CBC holding the current dosing until we have a firm date for evaluation.  In the meantime patient has been suggested to continue at current dosing of 100 mg a day. - Ambulatory referral to Psychiatry  2. Anxiety  Chronic.  Relatively controlled.  With a gad score of 4.  So there is more of a depressant concern and I am wondering if its reactive depression to the setting that she is and as well as unresolved from the initial situation that initiated the depression.  Again maintaining on current dosing of sertraline is suggested until we can get in with CBC and have discussion of a different approach/augmentation/dosing of current medication regimen. - Ambulatory referral to Psychiatry

## 2021-07-12 ENCOUNTER — Other Ambulatory Visit: Payer: Self-pay | Admitting: Family Medicine

## 2021-07-12 DIAGNOSIS — F329 Major depressive disorder, single episode, unspecified: Secondary | ICD-10-CM

## 2021-07-12 DIAGNOSIS — F419 Anxiety disorder, unspecified: Secondary | ICD-10-CM

## 2021-07-12 NOTE — Telephone Encounter (Signed)
Requested Prescriptions  Pending Prescriptions Disp Refills   sertraline (ZOLOFT) 100 MG tablet [Pharmacy Med Name: SERTRALINE 100MG  TABLETS] 90 tablet 0    Sig: TAKE 1 TABLET(100 MG) BY MOUTH DAILY     Psychiatry:  Antidepressants - SSRI Passed - 07/12/2021  3:42 AM      Passed - Completed PHQ-2 or PHQ-9 in the last 360 days      Passed - Valid encounter within last 6 months    Recent Outpatient Visits          1 month ago Reactive depression   [facility] 07/14/2021, MD   9 months ago Chronic cough   [facility] Duanne Limerick, MD   10 months ago Acute maxillary sinusitis, recurrence not specified   [facility]   10 months ago Reactive depression   [facility] MD   1 year ago Bronchitis   [facility], MD

## 2021-07-31 ENCOUNTER — Ambulatory Visit: Payer: No Typology Code available for payment source

## 2021-08-07 ENCOUNTER — Other Ambulatory Visit: Payer: Self-pay

## 2021-08-07 ENCOUNTER — Ambulatory Visit (INDEPENDENT_AMBULATORY_CARE_PROVIDER_SITE_OTHER): Payer: No Typology Code available for payment source

## 2021-08-07 DIAGNOSIS — E538 Deficiency of other specified B group vitamins: Secondary | ICD-10-CM

## 2021-08-07 MED ORDER — CYANOCOBALAMIN 1000 MCG/ML IJ SOLN
1000.0000 ug | Freq: Once | INTRAMUSCULAR | Status: AC
  Administered 2021-08-07: 1000 ug via INTRAMUSCULAR

## 2021-08-28 ENCOUNTER — Other Ambulatory Visit: Payer: Self-pay

## 2021-08-28 DIAGNOSIS — E538 Deficiency of other specified B group vitamins: Secondary | ICD-10-CM

## 2021-08-28 NOTE — Progress Notes (Signed)
Hematology ref placed ?

## 2021-08-30 ENCOUNTER — Ambulatory Visit: Payer: No Typology Code available for payment source | Admitting: Family Medicine

## 2021-09-06 ENCOUNTER — Other Ambulatory Visit: Payer: Self-pay

## 2021-09-06 ENCOUNTER — Ambulatory Visit (INDEPENDENT_AMBULATORY_CARE_PROVIDER_SITE_OTHER): Payer: No Typology Code available for payment source

## 2021-09-06 DIAGNOSIS — E538 Deficiency of other specified B group vitamins: Secondary | ICD-10-CM | POA: Diagnosis not present

## 2021-09-06 MED ORDER — CYANOCOBALAMIN 1000 MCG/ML IJ SOLN
1000.0000 ug | Freq: Once | INTRAMUSCULAR | Status: AC
  Administered 2021-09-06: 1000 ug via INTRAMUSCULAR

## 2021-09-07 ENCOUNTER — Encounter: Payer: Self-pay | Admitting: *Deleted

## 2021-09-09 DIAGNOSIS — E538 Deficiency of other specified B group vitamins: Secondary | ICD-10-CM | POA: Insufficient documentation

## 2021-09-09 NOTE — Progress Notes (Deleted)
?[facility]  ?[phone]) [phone] [phone] ? ?ID: Heidi Johnston OB: October 11, 1985  MR#: [id number]  NLZ#:[id number] ? ?Patient Care Team: ?Duanne Limerick, MD as PCP - General (Family Medicine) ? ?CHIEF COMPLAINT: B12 deficiency ? ?INTERVAL HISTORY: *** ? ?REVIEW OF SYSTEMS:   ?ROS ? ?As per HPI. Otherwise, a complete review of systems is negative. ? ?PAST MEDICAL HISTORY: ?Past Medical History:  ?Diagnosis Date  ? Abnormal Pap smear of cervix 2012  ? Anxiety and depression   ? B12 deficiency   ? Endometriosis 2011  ? Ovarian cyst   ? ? ?PAST SURGICAL HISTORY: ?Past Surgical History:  ?Procedure Laterality Date  ? LAPAROSCOPY    ? endometriosis  ? TONSILLECTOMY  2015  ? ? ?FAMILY HISTORY: ?Family History  ?Problem Relation Age of Onset  ? Bladder Cancer Mother   ? Diabetes Father   ? Heart attack Maternal Grandfather   ? Diabetes Paternal Grandfather   ? Heart disease Paternal Grandfather   ? ? ?ADVANCED DIRECTIVES (Y/N):  N ? ?HEALTH MAINTENANCE: ?Social History  ? ?Tobacco Use  ? Smoking status: Never  ? Smokeless tobacco: Never  ?Substance Use Topics  ? Alcohol use: Never  ? Drug use: Never  ? ? ? Colonoscopy: ? PAP: ? Bone density: ? Lipid panel: ? ?No Known Allergies ? ?Current Outpatient Medications  ?Medication Sig Dispense Refill  ? albuterol (VENTOLIN HFA) 108 (90 Base) MCG/ACT inhaler Inhale 2 puffs into the lungs every 6 (six) hours as needed for wheezing or shortness of breath. (Patient not taking: Reported on 06/01/2021) 8 g 2  ? cyanocobalamin (,VITAMIN B-12,) 1000 MCG/ML injection Inject into the muscle.    ? ergocalciferol (VITAMIN D2) 1.25 MG (50000 UT) capsule Take 1 capsule by mouth once a week. GYN    ? Norethindrone-Ethinyl Estradiol-Fe Biphas (LO LOESTRIN FE) 1 MG-10 MCG / 10 MCG tablet Take 1 tablet by mouth daily.    ? sertraline (ZOLOFT) 100 MG tablet TAKE 1 TABLET(100 MG) BY MOUTH DAILY 90 tablet 1  ? ?No current facility-administered medications for this  visit.  ? ? ?OBJECTIVE: ?There were no vitals filed for this visit.   There is no height or weight on file to calculate BMI.    ECOG FS:{CHL ONC [id number]} ? ?General: Well-developed, well-nourished, no acute distress. ?Eyes: Pink conjunctiva, anicteric sclera. ?HEENT: Normocephalic, moist mucous membranes. ?Lungs: No audible wheezing or coughing. ?Heart: Regular rate and rhythm. ?Abdomen: Soft, nontender, no obvious distention. ?Musculoskeletal: No edema, cyanosis, or clubbing. ?Neuro: Alert, answering all questions appropriately. Cranial nerves grossly intact. ?Skin: No rashes or petechiae noted. ?Psych: Normal affect. ?Lymphatics: No cervical, calvicular, axillary or inguinal LAD. ? ? ?LAB RESULTS: ? ?Lab Results  ?Component Value Date  ? NA 138 07/14/2018  ? K 4.2 07/14/2018  ? CL 101 07/14/2018  ? CO2 23 07/14/2018  ? GLUCOSE 94 07/14/2018  ? BUN 10 07/14/2018  ? CREATININE 0.94 07/14/2018  ? CALCIUM 9.4 07/14/2018  ? ALBUMIN 4.5 07/14/2018  ? GFRNONAA 81 07/14/2018  ? GFRAA 93 07/14/2018  ? ? ?Lab Results  ?Component Value Date  ? WBC 9.9 08/29/2020  ? NEUTROABS 5.6 08/29/2020  ? HGB 13.8 08/29/2020  ? HCT 41.1 08/29/2020  ? MCV 85 08/29/2020  ? PLT 314 08/29/2020  ? ? ? ?STUDIES: ?No results found. ? ?ASSESSMENT: B12 deficiency ? ?PLAN:   ? ?B12 deficiency: ? ?Patient expressed understanding and was in agreement with this plan. She also understands that She can call  clinic at any time with any questions, concerns, or complaints.  ? ? Cancer Staging  ?No matching staging information was found for the patient. ? ?Heidi Ruths, MD   09/09/2021 9:51 AM ? ? ? ? ?

## 2021-09-11 ENCOUNTER — Encounter: Payer: Self-pay | Admitting: Oncology

## 2021-09-12 ENCOUNTER — Inpatient Hospital Stay: Payer: No Typology Code available for payment source | Attending: Oncology | Admitting: Oncology

## 2021-09-12 ENCOUNTER — Inpatient Hospital Stay: Payer: No Typology Code available for payment source

## 2021-09-12 ENCOUNTER — Encounter: Payer: Self-pay | Admitting: Oncology

## 2021-09-12 ENCOUNTER — Other Ambulatory Visit: Payer: Self-pay

## 2021-09-12 VITALS — BP 140/107 | HR 109 | Temp 98.8°F | Resp 16 | Ht 68.0 in | Wt 296.0 lb

## 2021-09-12 DIAGNOSIS — E538 Deficiency of other specified B group vitamins: Secondary | ICD-10-CM | POA: Diagnosis present

## 2021-09-12 LAB — CBC WITH DIFFERENTIAL/PLATELET
Abs Immature Granulocytes: 0.03 10*3/uL (ref 0.00–0.07)
Basophils Absolute: 0.1 10*3/uL (ref 0.0–0.1)
Basophils Relative: 1 %
Eosinophils Absolute: 0.1 10*3/uL (ref 0.0–0.5)
Eosinophils Relative: 1 %
HCT: 42.8 % (ref 36.0–46.0)
Hemoglobin: 14 g/dL (ref 12.0–15.0)
Immature Granulocytes: 0 %
Lymphocytes Relative: 26 %
Lymphs Abs: 2.7 10*3/uL (ref 0.7–4.0)
MCH: 28.3 pg (ref 26.0–34.0)
MCHC: 32.7 g/dL (ref 30.0–36.0)
MCV: 86.5 fL (ref 80.0–100.0)
Monocytes Absolute: 0.6 10*3/uL (ref 0.1–1.0)
Monocytes Relative: 6 %
Neutro Abs: 6.6 10*3/uL (ref 1.7–7.7)
Neutrophils Relative %: 66 %
Platelets: 333 10*3/uL (ref 150–400)
RBC: 4.95 MIL/uL (ref 3.87–5.11)
RDW: 13.2 % (ref 11.5–15.5)
WBC: 10.2 10*3/uL (ref 4.0–10.5)
nRBC: 0 % (ref 0.0–0.2)

## 2021-09-12 LAB — FOLATE: Folate: 17.7 ng/mL (ref >5.9)

## 2021-09-12 LAB — VITAMIN B12: Vitamin B-12: 427 pg/mL (ref 180–914)

## 2021-09-13 ENCOUNTER — Encounter: Payer: BC Managed Care – PPO | Admitting: Oncology

## 2021-09-13 ENCOUNTER — Other Ambulatory Visit: Payer: BC Managed Care – PPO

## 2021-09-13 DIAGNOSIS — E538 Deficiency of other specified B group vitamins: Secondary | ICD-10-CM

## 2021-09-13 LAB — HOMOCYSTEINE: Homocysteine: 10 umol/L (ref 0.0–14.5)

## 2021-09-14 LAB — METHYLMALONIC ACID, SERUM: Methylmalonic Acid, Quantitative: 100 nmol/L (ref 0–378)

## 2021-09-14 NOTE — Progress Notes (Signed)
?[facility]  ?Telephone:(336) [phone] [phone] ? ?ID: Heidi Johnston OB: 03/24/1986  MR#: [id number]  ZSW#:[id number] ? ?Patient Care Team: ?Duanne Limerick, MD as PCP - General (Family Medicine) ? ?CHIEF COMPLAINT: B12 deficiency ? ?INTERVAL HISTORY: Patient is a 36 year old female who is actively receiving treatment for B12 deficiency.  Despite monthly injections, patient's B12 levels remain mildly decreased and she is referred for further evaluation.  She has chronic weakness and fatigue, but otherwise feels well.  She has no neurologic complaints.  She denies any recent fevers or illnesses.  She has a good appetite and denies weight loss.  She has no chest pain, shortness of breath, cough, or hemoptysis.  She denies any nausea, vomiting, constipation, or diarrhea.  She has no urinary complaints.  Patient offers no further specific complaints today. ? ?REVIEW OF SYSTEMS:   ?Review of Systems  ?Constitutional:  Positive for malaise/fatigue. Negative for fever and weight loss.  ?Respiratory: Negative.  Negative for cough, hemoptysis and shortness of breath.   ?Cardiovascular: Negative.  Negative for chest pain and leg swelling.  ?Gastrointestinal: Negative.  Negative for abdominal pain.  ?Genitourinary: Negative.  Negative for dysuria.  ?Musculoskeletal: Negative.  Negative for back pain.  ?Skin: Negative.  Negative for rash.  ?Neurological:  Positive for weakness. Negative for dizziness, focal weakness and headaches.  ?Psychiatric/Behavioral: Negative.  The patient is not nervous/anxious.   ? ?As per HPI. Otherwise, a complete review of systems is negative. ? ?PAST MEDICAL HISTORY: ?Past Medical History:  ?Diagnosis Date  ? Abnormal Pap smear of cervix 2012  ? Anxiety and depression   ? B12 deficiency   ? Endometriosis 2011  ? Ovarian cyst   ? ? ?PAST SURGICAL HISTORY: ?Past Surgical History:  ?Procedure Laterality Date  ? LAPAROSCOPY    ? endometriosis  ? TONSILLECTOMY  2015   ? ? ?FAMILY HISTORY: ?Family History  ?Problem Relation Age of Onset  ? Bladder Cancer Mother   ? Heart disease Father   ? Diabetes Father   ? Heart attack Maternal Grandfather   ? Diabetes Paternal Grandfather   ? Heart disease Paternal Grandfather   ? ? ?ADVANCED DIRECTIVES (Y/N):  N ? ?HEALTH MAINTENANCE: ?Social History  ? ?Tobacco Use  ? Smoking status: Never  ? Smokeless tobacco: Never  ?Substance Use Topics  ? Alcohol use: Never  ? Drug use: Never  ? ? ? Colonoscopy: ? PAP: ? Bone density: ? Lipid panel: ? ?No Known Allergies ? ?Current Outpatient Medications  ?Medication Sig Dispense Refill  ? cyanocobalamin (,VITAMIN B-12,) 1000 MCG/ML injection Inject into the muscle.    ? ergocalciferol (VITAMIN D2) 1.25 MG (50000 UT) capsule Take 1 capsule by mouth once a week. GYN    ? Norethindrone-Ethinyl Estradiol-Fe Biphas (LO LOESTRIN FE) 1 MG-10 MCG / 10 MCG tablet Take 1 tablet by mouth daily.    ? FLUoxetine (PROZAC) 40 MG capsule Take 40 mg by mouth daily.    ? ?No current facility-administered medications for this visit.  ? ? ?OBJECTIVE: ?Vitals:  ? 09/12/21 1420  ?BP: (!) 140/107  ?Pulse: (!) 109  ?Resp: 16  ?Temp: 98.8 ?F (37.1 ?C)  ?SpO2: 98%  ?   Body mass index is 45.01 kg/m?Marland Kitchen    ECOG FS:0 - Asymptomatic ? ?General: Well-developed, well-nourished, no acute distress. ?Eyes: Pink conjunctiva, anicteric sclera. ?HEENT: Normocephalic, moist mucous membranes. ?Lungs: No audible wheezing or coughing. ?Heart: Regular rate and rhythm. ?Abdomen: Soft, nontender, no obvious distention. ?Musculoskeletal: No edema,  cyanosis, or clubbing. ?Neuro: Alert, answering all questions appropriately. Cranial nerves grossly intact. ?Skin: No rashes or petechiae noted. ?Psych: Normal affect. ?Lymphatics: No cervical, calvicular, axillary or inguinal LAD. ? ? ?LAB RESULTS: ? ?Lab Results  ?Component Value Date  ? NA 138 07/14/2018  ? K 4.2 07/14/2018  ? CL 101 07/14/2018  ? CO2 23 07/14/2018  ? GLUCOSE 94 07/14/2018  ? BUN 10  07/14/2018  ? CREATININE 0.94 07/14/2018  ? CALCIUM 9.4 07/14/2018  ? ALBUMIN 4.5 07/14/2018  ? GFRNONAA 81 07/14/2018  ? GFRAA 93 07/14/2018  ? ? ?Lab Results  ?Component Value Date  ? WBC 10.2 09/12/2021  ? NEUTROABS 6.6 09/12/2021  ? HGB 14.0 09/12/2021  ? HCT 42.8 09/12/2021  ? MCV 86.5 09/12/2021  ? PLT 333 09/12/2021  ? ? ? ?STUDIES: ?No results found. ? ?ASSESSMENT: B12 deficiency ? ?PLAN:   ? ?B12 deficiency: Patient's B12 levels are currently within normal limits at 427.  She does not have anemia or macrocytosis.  Folate, homocystine, and MMA levels are also within normal limits.  No further interventions are needed.  Can consider GI work-up to assess for celiac disease if patient remains persistently B12 deficient.  Continue B12 injections at home as per primary care.  Patient will have video-assisted telemedicine visit in approximately 3 weeks to discuss her results. ? ?I spent a total of 45 minutes reviewing chart data, face-to-face evaluation with the patient, counseling and coordination of care as detailed above. ? ? ?Patient expressed understanding and was in agreement with this plan. She also understands that She can call clinic at any time with any questions, concerns, or complaints.  ? ? ? ?[name], MD   09/14/2021 6:03 AM ? ? ? ? ?

## 2021-10-04 ENCOUNTER — Ambulatory Visit (INDEPENDENT_AMBULATORY_CARE_PROVIDER_SITE_OTHER): Payer: No Typology Code available for payment source

## 2021-10-04 ENCOUNTER — Inpatient Hospital Stay: Payer: No Typology Code available for payment source | Attending: Oncology | Admitting: Oncology

## 2021-10-04 DIAGNOSIS — E538 Deficiency of other specified B group vitamins: Secondary | ICD-10-CM

## 2021-10-04 MED ORDER — CYANOCOBALAMIN 1000 MCG/ML IJ SOLN
1000.0000 ug | Freq: Once | INTRAMUSCULAR | Status: AC
  Administered 2021-10-04: 1000 ug via INTRAMUSCULAR

## 2021-10-04 NOTE — Progress Notes (Signed)
?[facility]  ?[phone]) [phone] [phone]) [phone] ? ?ID: Heidi Johnston OB: September 14, 1985  MR#: [id number]  [id number] ? ?Patient Care Team: ?Juline Patch, MD as PCP - General (Family Medicine) ? ?I connected with Heidi Johnston on 10/08/21 at  3:30 PM EDT by video enabled telemedicine visit and verified that I am speaking with the correct person using two identifiers.  ? ?I discussed the limitations, risks, security and privacy concerns of performing an evaluation and management service by telemedicine and the availability of in-person appointments. I also discussed with the patient that there may be a patient responsible charge related to this service. The patient expressed understanding and agreed to proceed.  ? ?Other persons participating in the visit and their role in the encounter: Patient, MD. ? ?Patient?s location: Home. ?Provider?s location: Clinic. ? ?CHIEF COMPLAINT: B12 deficiency ? ?INTERVAL HISTORY: Patient agreed to video assisted telemedicine visit for further evaluation and discussion of her laboratory results.  She does not complain of any weakness or fatigue today. She has no neurologic complaints.  She denies any recent fevers or illnesses.  She has a good appetite and denies weight loss.  She has no chest pain, shortness of breath, cough, or hemoptysis.  She denies any nausea, vomiting, constipation, or diarrhea.  She has no urinary complaints.  Patient offers no specific complaints today. ? ?REVIEW OF SYSTEMS:   ?Review of Systems  ?Constitutional: Negative.  Negative for fever, malaise/fatigue and weight loss.  ?Respiratory: Negative.  Negative for cough, hemoptysis and shortness of breath.   ?Cardiovascular: Negative.  Negative for chest pain and leg swelling.  ?Gastrointestinal: Negative.  Negative for abdominal pain.  ?Genitourinary: Negative.  Negative for dysuria.  ?Musculoskeletal: Negative.  Negative for back pain.  ?Skin: Negative.  Negative for rash.   ?Neurological: Negative.  Negative for dizziness, focal weakness, weakness and headaches.  ?Psychiatric/Behavioral: Negative.  The patient is not nervous/anxious.   ? ?As per HPI. Otherwise, a complete review of systems is negative. ? ?PAST MEDICAL HISTORY: ?Past Medical History:  ?Diagnosis Date  ? Abnormal Pap smear of cervix 2012  ? Anxiety and depression   ? B12 deficiency   ? Endometriosis 2011  ? Ovarian cyst   ? ? ?PAST SURGICAL HISTORY: ?Past Surgical History:  ?Procedure Laterality Date  ? LAPAROSCOPY    ? endometriosis  ? TONSILLECTOMY  2015  ? ? ?FAMILY HISTORY: ?Family History  ?Problem Relation Age of Onset  ? Bladder Cancer Mother   ? Heart disease Father   ? Diabetes Father   ? Heart attack Maternal Grandfather   ? Diabetes Paternal Grandfather   ? Heart disease Paternal Grandfather   ? ? ?ADVANCED DIRECTIVES (Y/N):  N ? ?HEALTH MAINTENANCE: ?Social History  ? ?Tobacco Use  ? Smoking status: Never  ? Smokeless tobacco: Never  ?Substance Use Topics  ? Alcohol use: Never  ? Drug use: Never  ? ? ? Colonoscopy: ? PAP: ? Bone density: ? Lipid panel: ? ?No Known Allergies ? ?Current Outpatient Medications  ?Medication Sig Dispense Refill  ? cyanocobalamin (,VITAMIN B-12,) 1000 MCG/ML injection Inject into the muscle.    ? ergocalciferol (VITAMIN D2) 1.25 MG (50000 UT) capsule Take 1 capsule by mouth once a week. GYN    ? FLUoxetine (PROZAC) 40 MG capsule Take 40 mg by mouth daily.    ? Norethindrone-Ethinyl Estradiol-Fe Biphas (LO LOESTRIN FE) 1 MG-10 MCG / 10 MCG tablet Take 1 tablet by mouth daily.    ? ?No current  facility-administered medications for this visit.  ? ? ?OBJECTIVE: ?There were no vitals filed for this visit. ?   There is no height or weight on file to calculate BMI.    ECOG FS:0 - Asymptomatic ? ?General: Well-developed, well-nourished, no acute distress. ?HEENT: Normocephalic. ?Neuro: Alert, answering all questions appropriately. Cranial nerves grossly intact. ?Psych: Normal  affect. ? ? ?LAB RESULTS: ? ?Lab Results  ?Component Value Date  ? NA 138 07/14/2018  ? K 4.2 07/14/2018  ? CL 101 07/14/2018  ? CO2 23 07/14/2018  ? GLUCOSE 94 07/14/2018  ? BUN 10 07/14/2018  ? CREATININE 0.94 07/14/2018  ? CALCIUM 9.4 07/14/2018  ? ALBUMIN 4.5 07/14/2018  ? GFRNONAA 81 07/14/2018  ? GFRAA 93 07/14/2018  ? ? ?Lab Results  ?Component Value Date  ? WBC 10.2 09/12/2021  ? NEUTROABS 6.6 09/12/2021  ? HGB 14.0 09/12/2021  ? HCT 42.8 09/12/2021  ? MCV 86.5 09/12/2021  ? PLT 333 09/12/2021  ? ? ? ?STUDIES: ?No results found. ? ?ASSESSMENT: B12 deficiency ? ?PLAN:   ? ?B12 deficiency: Resolved.  Patient's B12 levels are currently within normal limits at 427.  She does not have anemia or macrocytosis.  Folate, homocystine, and MMA levels are also within normal limits.  No further interventions are needed.  Can consider GI work-up to assess for celiac disease if patient remains persistently B12 deficient.  Continue B12 injections at home as per primary care.  No further follow-up is necessary.  Please refer patient back if there are any questions or concerns. ? ?I provided 20 minutes of face-to-face video visit time during this encounter which included chart review, counseling, and coordination of care as documented above. ? ? ?Patient expressed understanding and was in agreement with this plan. She also understands that She can call clinic at any time with any questions, concerns, or complaints.  ? ? ? ?Lloyd Huger, MD   10/08/2021 6:07 AM ? ? ? ? ?

## 2021-10-26 ENCOUNTER — Encounter: Payer: Self-pay | Admitting: Family Medicine

## 2021-10-26 ENCOUNTER — Encounter: Payer: Self-pay | Admitting: Oncology

## 2021-10-29 ENCOUNTER — Inpatient Hospital Stay: Payer: No Typology Code available for payment source | Attending: Oncology

## 2021-10-29 DIAGNOSIS — E538 Deficiency of other specified B group vitamins: Secondary | ICD-10-CM | POA: Insufficient documentation

## 2021-10-29 LAB — CBC WITH DIFFERENTIAL/PLATELET
Abs Immature Granulocytes: 0.02 10*3/uL (ref 0.00–0.07)
Basophils Absolute: 0.1 10*3/uL (ref 0.0–0.1)
Basophils Relative: 1 %
Eosinophils Absolute: 0.1 10*3/uL (ref 0.0–0.5)
Eosinophils Relative: 1 %
HCT: 40.8 % (ref 36.0–46.0)
Hemoglobin: 13.6 g/dL (ref 12.0–15.0)
Immature Granulocytes: 0 %
Lymphocytes Relative: 33 %
Lymphs Abs: 2.6 10*3/uL (ref 0.7–4.0)
MCH: 28.6 pg (ref 26.0–34.0)
MCHC: 33.3 g/dL (ref 30.0–36.0)
MCV: 85.9 fL (ref 80.0–100.0)
Monocytes Absolute: 0.4 10*3/uL (ref 0.1–1.0)
Monocytes Relative: 5 %
Neutro Abs: 4.9 10*3/uL (ref 1.7–7.7)
Neutrophils Relative %: 60 %
Platelets: 282 10*3/uL (ref 150–400)
RBC: 4.75 MIL/uL (ref 3.87–5.11)
RDW: 13.3 % (ref 11.5–15.5)
WBC: 8 10*3/uL (ref 4.0–10.5)
nRBC: 0 % (ref 0.0–0.2)

## 2021-10-29 LAB — FOLATE: Folate: 12.4 ng/mL (ref >5.9)

## 2021-10-29 LAB — VITAMIN B12: Vitamin B-12: 272 pg/mL (ref 180–914)

## 2021-10-30 LAB — HOMOCYSTEINE: Homocysteine: 8.1 umol/L (ref 0.0–14.5)

## 2021-10-31 LAB — METHYLMALONIC ACID, SERUM: Methylmalonic Acid, Quantitative: 110 nmol/L (ref 0–378)

## 2021-12-04 ENCOUNTER — Ambulatory Visit (INDEPENDENT_AMBULATORY_CARE_PROVIDER_SITE_OTHER): Payer: No Typology Code available for payment source

## 2021-12-04 ENCOUNTER — Encounter: Payer: Self-pay | Admitting: Oncology

## 2021-12-04 DIAGNOSIS — E538 Deficiency of other specified B group vitamins: Secondary | ICD-10-CM

## 2021-12-04 MED ORDER — CYANOCOBALAMIN 1000 MCG/ML IJ SOLN
1000.0000 ug | Freq: Once | INTRAMUSCULAR | Status: AC
  Administered 2021-12-04: 1000 ug via INTRAMUSCULAR

## 2021-12-05 ENCOUNTER — Telehealth: Payer: Self-pay

## 2021-12-14 ENCOUNTER — Ambulatory Visit: Payer: No Typology Code available for payment source | Admitting: Family Medicine

## 2021-12-14 ENCOUNTER — Encounter: Payer: Self-pay | Admitting: Family Medicine

## 2021-12-14 VITALS — BP 140/88 | HR 100 | Ht 68.0 in | Wt 297.0 lb

## 2021-12-14 DIAGNOSIS — E538 Deficiency of other specified B group vitamins: Secondary | ICD-10-CM

## 2021-12-14 MED ORDER — CYANOCOBALAMIN 1000 MCG/ML IJ SOLN: 1000.0000 ug | INTRAMUSCULAR | 6 refills | Status: AC

## 2022-01-10 NOTE — Telephone Encounter (Signed)
Error

## 2022-03-15 ENCOUNTER — Other Ambulatory Visit: Payer: Self-pay | Admitting: Family Medicine

## 2022-03-15 NOTE — Telephone Encounter (Signed)
Rx 12/14/21 50ml 6RF Requested Prescriptions  Pending Prescriptions Disp Refills  . cyanocobalamin (VITAMIN B12) 1000 MCG/ML injection [Pharmacy Med Name: CYANOCOBALAMIN 1,000 MCG/ML VL] 3 mL 2    Sig: INJECT 1 ML (1,000 MCG TOTAL) INTO THE MUSCLE EVERY 30 DAYS.     Endocrinology:  Vitamins - Vitamin B12 Passed - 03/15/2022  2:14 AM      Passed - HCT in normal range and within 360 days    HCT  Date Value Ref Range Status  10/29/2021 40.8 36.0 - 46.0 % Final   Hematocrit  Date Value Ref Range Status  08/29/2020 41.1 34.0 - 46.6 % Final         Passed - HGB in normal range and within 360 days    Hemoglobin  Date Value Ref Range Status  10/29/2021 13.6 12.0 - 15.0 g/dL Final  08/29/2020 13.8 11.1 - 15.9 g/dL Final         Passed - B12 Level in normal range and within 360 days    Vitamin B-12  Date Value Ref Range Status  10/29/2021 272 180 - 914 pg/mL Final    Comment:    (NOTE) This assay is not validated for testing neonatal or myeloproliferative syndrome specimens for Vitamin B12 levels. Performed at [facility], [place] [address], [place], [place] [address]          Passed - Valid encounter within last 12 months    Recent Outpatient Visits          3 months ago B12 deficiency   [facility] [place], Deanna C, MD   9 months ago Reactive depression   [facility] [place], Deanna C, MD   1 year ago Chronic cough   [facility] [place], Deanna C, MD   1 year ago Acute maxillary sinusitis, recurrence not specified   [facility] [place], Deanna C, MD   1 year ago Reactive depression   [facility] [place], Deanna C, MD

## 2022-04-07 ENCOUNTER — Other Ambulatory Visit: Payer: Self-pay | Admitting: Family Medicine

## 2023-02-05 IMAGING — CR DG CHEST 2V
2 series · 2 of 2 positions shown · non-contrast
Comparison: None.

CLINICAL DATA: Cough. Asthma. Patient had [id number] infection in
June 2020.

EXAM:
CHEST - 2 VIEW

[chest pa]
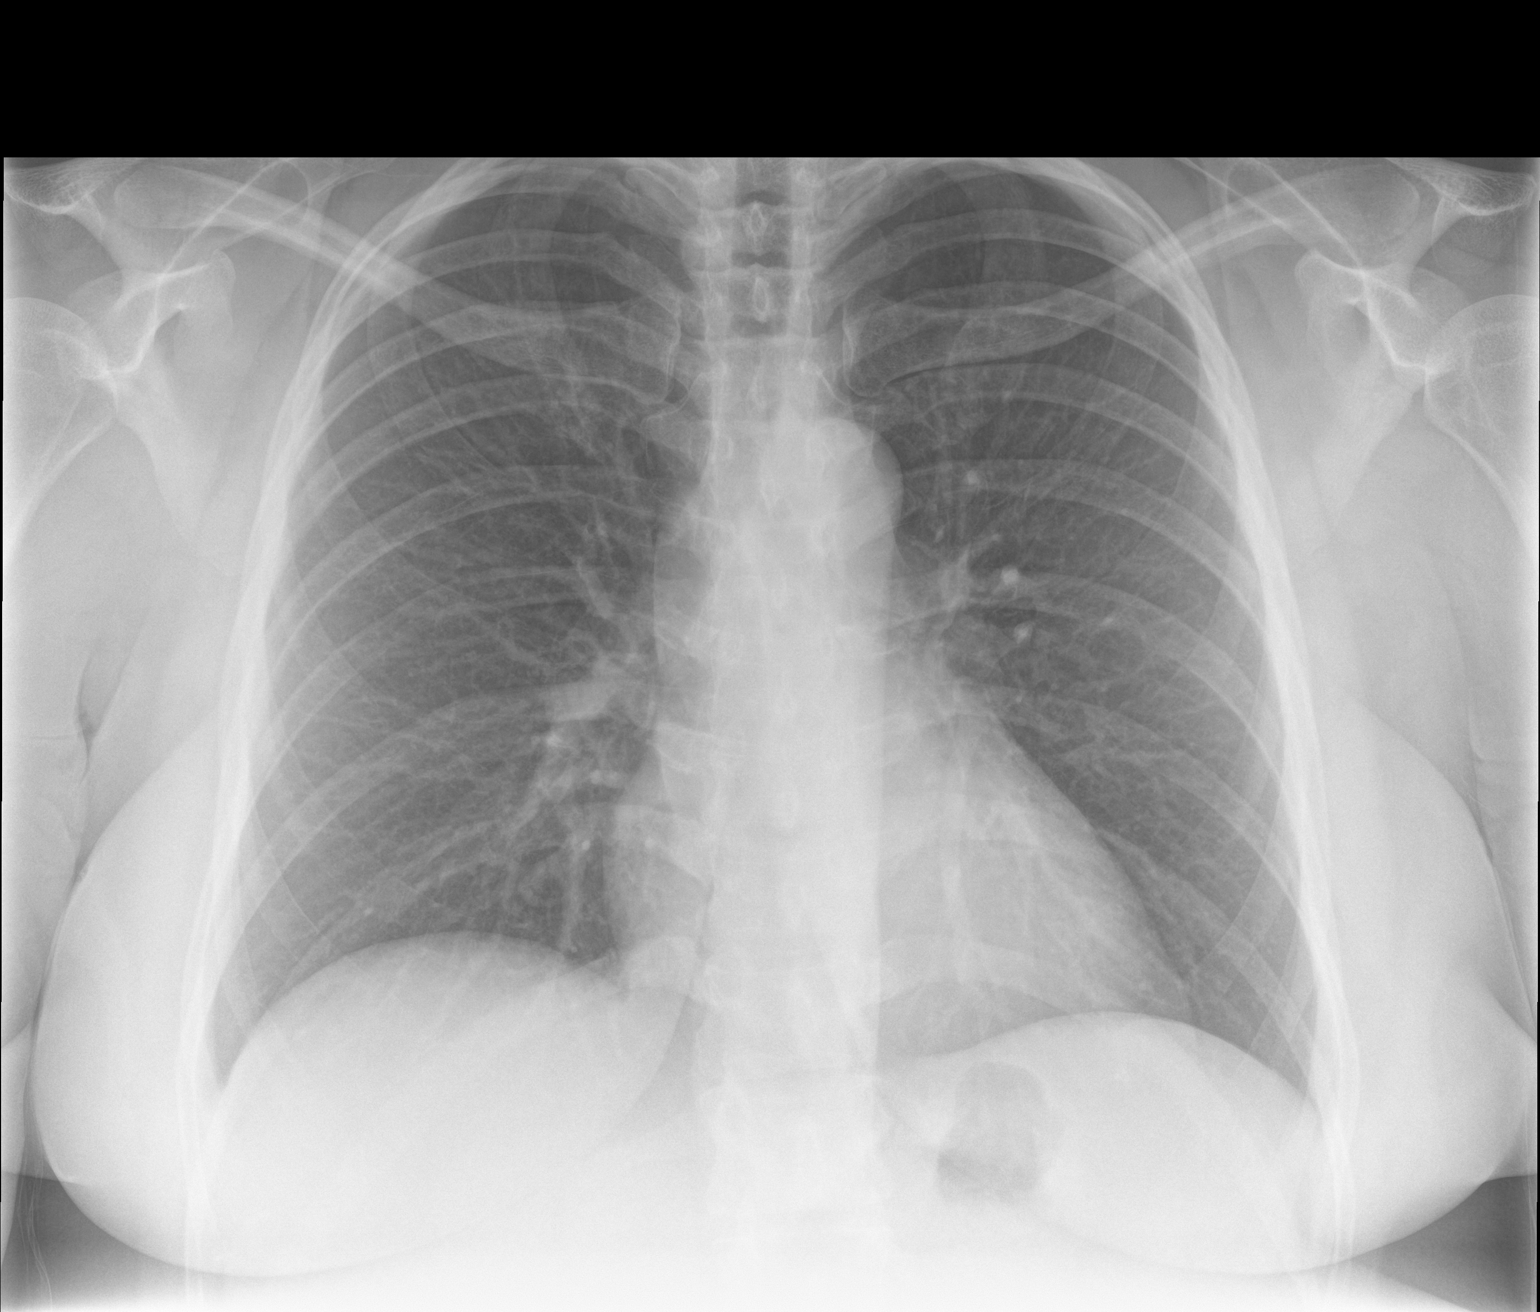

[chest lat]
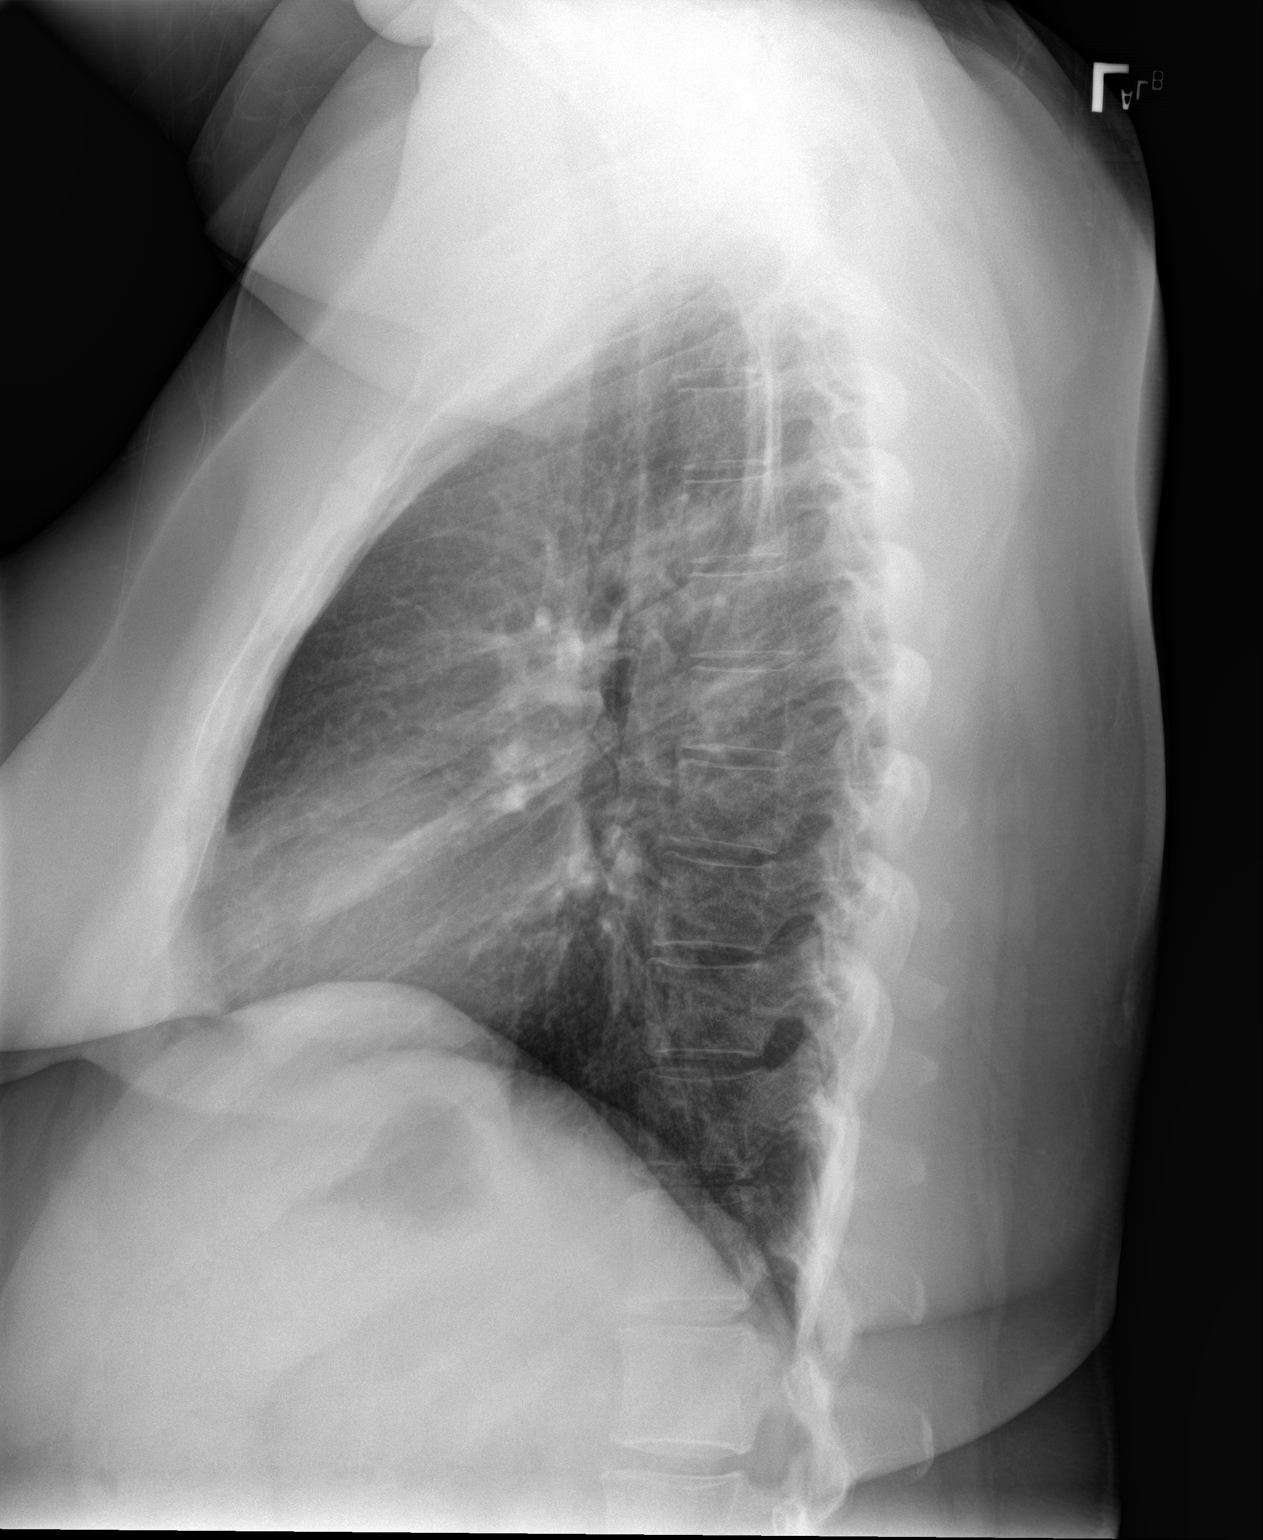

[2 of 2 positions shown; findings below may reference images not displayed]

FINDINGS: Normal heart, mediastinum and hila.

Clear lungs.  No pleural effusion or pneumothorax.

Skeletal structures are unremarkable.
IMPRESSION: Normal chest radiographs.
